# Patient Record
Sex: Male | Born: 1945 | Race: White | Hispanic: No | Smoking: Former smoker
Health system: Southern US, Community
[De-identification: ages and names within clinical notes are randomized; demographics above are authoritative.]

## PROBLEM LIST (undated history)

## (undated) HISTORY — PX: CATARACT EXTRACTION: SUR2

## (undated) HISTORY — PX: TONSILLECTOMY: SUR1361

---

## 2009-01-09 ENCOUNTER — Ambulatory Visit (HOSPITAL_COMMUNITY): Admission: RE | Admit: 2009-01-09 | Discharge: 2009-01-09 | Payer: Self-pay | Admitting: Ophthalmology

## 2010-05-27 LAB — BASIC METABOLIC PANEL
CO2: 30 mEq/L (ref 19–32)
Calcium: 9.1 mg/dL (ref 8.4–10.5)
Creatinine, Ser: 0.95 mg/dL (ref 0.4–1.5)
GFR calc Af Amer: >60 mL/min (ref >60)
GFR calc non Af Amer: >60 mL/min (ref >60)

## 2010-05-27 LAB — HEMOGLOBIN AND HEMATOCRIT, BLOOD: HCT: 43.2 % (ref 39.0–52.0)

## 2011-02-10 ENCOUNTER — Encounter (HOSPITAL_COMMUNITY): Payer: Self-pay | Admitting: Pharmacy Technician

## 2011-02-18 ENCOUNTER — Encounter (HOSPITAL_COMMUNITY): Payer: Self-pay

## 2011-02-18 ENCOUNTER — Other Ambulatory Visit: Payer: Self-pay

## 2011-02-18 ENCOUNTER — Encounter (HOSPITAL_COMMUNITY)
Admission: RE | Admit: 2011-02-18 | Discharge: 2011-02-18 | Disposition: A | Discharge status: Home / Self Care | Payer: Medicare Other | Source: Ambulatory Visit | Attending: Ophthalmology | Admitting: Ophthalmology

## 2011-02-18 LAB — CBC
HCT: 42.5 % (ref 39.0–52.0)
MCHC: 32.7 g/dL (ref 30.0–36.0)
RDW: 13.8 % (ref 11.5–15.5)

## 2011-02-18 NOTE — Patient Instructions (Addendum)
19 EVONTE PRESTAGE  02/18/2011   Your procedure is scheduled on:  02/25/2011  Report to [facility] at  615  AM.  Call this number if you have problems the morning of surgery: [phone]   Remember:   Do not eat food:After Midnight.  May have clear liquids:until Midnight .  Clear liquids include soda, tea, black coffee, apple or grape juice, broth.  Take these medicines the morning of surgery with A SIP OF WATER: none   Do not wear jewelry, make-up or nail polish.  Do not wear lotions, powders, or perfumes. You may wear deodorant.  Do not shave 48 hours prior to surgery.  Do not bring valuables to the hospital.  Contacts, dentures or bridgework may not be worn into surgery.  Leave suitcase in the car. After surgery it may be brought to your room.  For patients admitted to the hospital, checkout time is 11:00 AM the day of discharge.   Patients discharged the day of surgery will not be allowed to drive home.  Name and phone number of your driver: family  Special Instructions: N/A   Please read over the following fact sheets that you were given: Pain Booklet, Surgical Site Infection Prevention, Anesthesia Post-op Instructions and Care and Recovery After Surgery Cataract A cataract is a clouding of the lens of the eye. It is most often related to aging. A cataract is not a "film" over the surface of the eye. The lens is inside the eye and changes size of the pupil. The lens can enlarge to let more light enter the eye in dark environments and contract the size of the pupil to let in bright light. The lens is the part of the eye that helps focus light on the retina. The retina is the eye's light-sensitive layer. It is in the back of the eye that sends visual signals to the brain. In a normal eye, light passes through the lens and gets focused on the retina. To help produce a sharp image, the lens must remain clear. When a lens becomes cloudy, vision is compromised by the degree and nature of the  clouding. Certain cataracts make people more near-sighted as they develop, others increase glare, and all reduce vision to some degree or another. A cataract that is so dense that it becomes milky white and a white opacity can be seen through the pupil. When the white color is seen, it is called a "mature" or "hyper-mature cataract." Such cataracts cause total blindness in the affected eye. The cataract must be removed to prevent damage to the eye itself. Some types of cataracts can cause a secondary disease of the eye, such as certain types of glaucoma. In the early stages, better lighting and eyeglasses may lessen vision problems caused by cataracts. At a certain point, surgery may be needed to improve vision. CAUSES   Aging. However, cataracts may occur at any age, even in newborns.   Certain drugs.   Trauma to the eye.   Certain diseases (such as diabetes).   Inherited or acquired medical syndromes.  SYMPTOMS   Gradual, progressive drop in vision in the affected eye. Cataracts may develop at different rates in each eye. Cataracts may even be in just one eye with the other unaffected.   Cataracts due to trauma may develop quickly, sometimes over a matter or days or even hours. The result is severe and rapid visual loss.  DIAGNOSIS  To detect a cataract, an eye doctor examines the lens.  A well developed cataract can be diagnosed without dilating the pupil. Early cataracts and others of a specific nature are best diagnosed with an exam of the eyes with the pupils dilated by drops. TREATMENT   For an early cataract, vision may improve by using different eyeglasses or stronger lighting.   If the above measures do not help, surgery is the only effective treatment. This treatment removes the cloudy lens and replaces it with a substitute lens (Intraocular lens, or IOL). Newly developed IOL technology allows the implanted lens to improve vision both at a distance and up close. Discuss with your  eye surgeon about the possibility of still needing glasses. Also discuss how visual coordination between both eyes will be affected.  A cataract needs to be removed only when vision loss interferes with your everyday activities such as driving, reading or watching TV. You and your eye doctor can make that decision together. In most cases, waiting until you are ready to have cataract surgery will not harm your eye. If you have cataracts in both eyes, only one should be removed at a time. This allows the operated eye to heal and be out of danger from serious problems (such as infection or poor wound healing) before having the other eye undergo surgery.  Sometimes, a cataract should be removed even if it does not cause problems with your vision. For example, a cataract should be removed if it prevents examination or treatment of another eye problem. Just as you cannot see out of the affected eye well, your doctor cannot see into your eye well through a cataract. The vast majority of people who have cataract surgery have better vision afterward. CATARACT REMOVAL There are two primary ways to remove a cataract. Your doctor can explain the differences and help determine which is best for you:  Phacoemulsification (small incision cataract surgery). This involves making a small cut (incision) on the edge of the clear, dome-shaped surface that covers the front of the eye (the cornea). An injection behind the eye or eye drops are given to make this a painless procedure. The doctor then inserts a tiny probe into the eye. This device emits ultrasound waves that soften and break up the cloudy center of the lens so it can be removed by suction. Most cataract surgery is done this way. The cuts are usually so small and performed in such a manner that often no sutures are needed to keep it closed.   Extracapsular surgery. Your doctor makes a slightly longer incision on the side of the cornea. The doctor removes the hard  center of the lens. The remainder of the lens is then removed by suction. In some cases, extremely fine sutures are needed which the doctor may, or may not remove in the office after the surgery.  When an IOL is implanted, it needs no care. It becomes a permanent part of your eye and cannot be seen or felt.  Some people cannot have an IOL. They may have problems during surgery, or maybe they have another eye disease. For these people, a soft contact lens may be suggested. If an IOL or contact lens cannot be used, very powerful and thick glasses are required after surgery. Since vision is very different through such thick glasses, it is important to have your doctor discuss the impact on your vision after any cataract surgery where there is no plan to implant an IOL. The normal lens of the eye is covered by a clear capsule. Both  phacoemulsification and extracapsular surgery require that the back surface of this lens capsule be left in place. This helps support IOLs and prevents the IOL from dislocating and falling back into the deeper interior of the eye. Right after surgery, and often permanently this "posterior capsule" remains clear. In some cases however, it can become cloudy, presenting the same type of visual compromise that the original cataract did since light is again obstructed as it passes through the clear IOL. This condition is often referred to as an "after-cataract." Fortunately, after-cataracts are easily treated using a painless and very fast laser treatment that is performed without anesthesia or incisions. It is done in a matter of minutes in an outpatient environment. Visual improvement is often immediate.  HOME CARE INSTRUCTIONS   Your surgeon will discuss pre and post operative care with you prior to surgery. The majority of people are able to do almost all normal activities right away. Although, it is often advised to avoid strenuous activity for a period of time.   Postoperative drops  and careful avoidance of infection will be needed. Many surgeons suggest the use of a protective shield during the first few days after surgery.   There is a very small incidence of complication from modern cataract surgery, but it can happen. Infection that spreads to the inside of the eye (endophthalmitis) can result in total visual loss and even loss of the eye itself. In extremely rare instances, the inflammation of endophthalmitis can spread to both eyes (sympathetic ophthalmia). Appropriate post-operative care under the close observation of your surgeon is essential to a successful outcome.  SEEK IMMEDIATE MEDICAL CARE IF:   You have any sudden drop of vision in the operated eye.   You have pain in the operated eye.   You see a large number of floating dots in the field of vision in the operated eye.   You see flashing lights, or if a portion of your side vision in any direction appears black (like a curtain being drawn into your field of vision) in the operated eye.  Document Released: 02/08/2005 Document Revised: 10/21/2010 Document Reviewed: 03/27/2007 [facility] Patient Information 2012 [place], [place].PATIENT INSTRUCTIONS POST-ANESTHESIA  IMMEDIATELY FOLLOWING SURGERY:  Do not drive or operate machinery for the first twenty four hours after surgery.  Do not make any important decisions for twenty four hours after surgery or while taking narcotic pain medications or sedatives.  If you develop intractable nausea and vomiting or a severe headache please notify your doctor immediately.  FOLLOW-UP:  Please make an appointment with your surgeon as instructed. You do not need to follow up with anesthesia unless specifically instructed to do so.  WOUND CARE INSTRUCTIONS (if applicable):  Keep a dry clean dressing on the anesthesia/puncture wound site if there is drainage.  Once the wound has quit draining you may leave it open to air.  Generally you should leave the bandage intact for twenty  four hours unless there is drainage.  If the epidural site drains for more than 36-48 hours please call the anesthesia department.  QUESTIONS?:  Please feel free to call your physician or the hospital operator if you have any questions, and they will be happy to assist you.     [facility] Anesthesia Department [address] [place] [phone]

## 2011-02-25 ENCOUNTER — Encounter (HOSPITAL_COMMUNITY): Payer: Self-pay | Admitting: Anesthesiology

## 2011-02-25 ENCOUNTER — Ambulatory Visit (HOSPITAL_COMMUNITY)
Admission: RE | Admit: 2011-02-25 | Discharge: 2011-02-25 | Disposition: A | Discharge status: Home / Self Care | Payer: Medicare Other | Source: Ambulatory Visit | Attending: Ophthalmology | Admitting: Ophthalmology

## 2011-02-25 ENCOUNTER — Encounter (HOSPITAL_COMMUNITY): Payer: Self-pay | Admitting: *Deleted

## 2011-02-25 ENCOUNTER — Encounter (HOSPITAL_COMMUNITY)
Admission: RE | Disposition: A | Discharge status: Home / Self Care | Payer: Self-pay | Source: Ambulatory Visit | Attending: Ophthalmology

## 2011-02-25 ENCOUNTER — Ambulatory Visit (HOSPITAL_COMMUNITY): Payer: Medicare Other | Admitting: Anesthesiology

## 2011-02-25 DIAGNOSIS — H2510 Age-related nuclear cataract, unspecified eye: Secondary | ICD-10-CM | POA: Insufficient documentation

## 2011-02-25 DIAGNOSIS — Z01812 Encounter for preprocedural laboratory examination: Secondary | ICD-10-CM | POA: Insufficient documentation

## 2011-02-25 DIAGNOSIS — Z01810 Encounter for preprocedural cardiovascular examination: Secondary | ICD-10-CM | POA: Diagnosis not present

## 2011-02-25 DIAGNOSIS — H269 Unspecified cataract: Secondary | ICD-10-CM | POA: Diagnosis not present

## 2011-02-25 HISTORY — PX: CATARACT EXTRACTION W/PHACO: SHX586

## 2011-02-25 SURGERY — PHACOEMULSIFICATION, CATARACT, WITH IOL INSERTION
Anesthesia: Monitor Anesthesia Care | Site: Eye | Laterality: Right | Wound class: Clean

## 2011-02-25 MED ORDER — MIDAZOLAM HCL 2 MG/2ML IJ SOLN
1.0000 mg | INTRAMUSCULAR | Status: DC | PRN
  Administered 2011-02-25: 2 mg via INTRAVENOUS

## 2011-02-25 MED ORDER — LACTATED RINGERS IV SOLN
INTRAVENOUS | Status: DC | PRN
  Administered 2011-02-25: 1000 mL
  Administered 2011-02-25: 07:00:00 via INTRAVENOUS

## 2011-02-25 MED ORDER — LIDOCAINE HCL (PF) 1 % IJ SOLN
INTRAMUSCULAR | Status: AC
  Filled 2011-02-25: qty 2

## 2011-02-25 MED ORDER — PROVISC 10 MG/ML IO SOLN
INTRAOCULAR | Status: DC | PRN
  Administered 2011-02-25: 8.5 mg via INTRAOCULAR

## 2011-02-25 MED ORDER — PHENYLEPHRINE HCL 2.5 % OP SOLN
1.0000 [drp] | OPHTHALMIC | Status: AC
  Administered 2011-02-25 (×3): 1 [drp] via OPHTHALMIC

## 2011-02-25 MED ORDER — TETRACAINE HCL 0.5 % OP SOLN
OPHTHALMIC | Status: AC
  Administered 2011-02-25: 1 [drp] via OPHTHALMIC
  Filled 2011-02-25: qty 2

## 2011-02-25 MED ORDER — TETRACAINE HCL 0.5 % OP SOLN
1.0000 [drp] | OPHTHALMIC | Status: AC
  Administered 2011-02-25 (×3): 1 [drp] via OPHTHALMIC

## 2011-02-25 MED ORDER — BSS IO SOLN
INTRAOCULAR | Status: DC | PRN
  Administered 2011-02-25: 15 mL via INTRAOCULAR

## 2011-02-25 MED ORDER — CYCLOPENTOLATE-PHENYLEPHRINE 0.2-1 % OP SOLN
OPHTHALMIC | Status: AC
  Administered 2011-02-25: 1 [drp] via OPHTHALMIC
  Filled 2011-02-25: qty 2

## 2011-02-25 MED ORDER — MIDAZOLAM HCL 2 MG/2ML IJ SOLN
INTRAMUSCULAR | Status: AC
  Filled 2011-02-25: qty 2

## 2011-02-25 MED ORDER — LIDOCAINE 3.5 % OP GEL OPTIME - NO CHARGE
OPHTHALMIC | Status: DC | PRN
  Administered 2011-02-25: 1 [drp] via OPHTHALMIC

## 2011-02-25 MED ORDER — LACTATED RINGERS IV SOLN: INTRAVENOUS | Status: DC

## 2011-02-25 MED ORDER — EPINEPHRINE HCL 1 MG/ML IJ SOLN
INTRAMUSCULAR | Status: AC
  Filled 2011-02-25: qty 1

## 2011-02-25 MED ORDER — LIDOCAINE HCL 3.5 % OP GEL
1.0000 "application " | Freq: Once | OPHTHALMIC | Status: AC
  Administered 2011-02-25: 1 via OPHTHALMIC

## 2011-02-25 MED ORDER — EPINEPHRINE HCL 1 MG/ML IJ SOLN
INTRAMUSCULAR | Status: DC | PRN
  Administered 2011-02-25: 07:00:00 via OPHTHALMIC

## 2011-02-25 MED ORDER — EPINEPHRINE HCL 1 MG/ML IJ SOLN
INTRAOCULAR | Status: DC | PRN
  Administered 2011-02-25: 08:00:00

## 2011-02-25 MED ORDER — LIDOCAINE HCL 3.5 % OP GEL
OPHTHALMIC | Status: AC
  Administered 2011-02-25: 1 via OPHTHALMIC
  Filled 2011-02-25: qty 5

## 2011-02-25 MED ORDER — NEOMYCIN-POLYMYXIN-DEXAMETH 0.1 % OP OINT
TOPICAL_OINTMENT | OPHTHALMIC | Status: DC | PRN
  Administered 2011-02-25: 1 via OPHTHALMIC

## 2011-02-25 MED ORDER — NEOMYCIN-POLYMYXIN-DEXAMETH 3.5-10000-0.1 OP OINT
TOPICAL_OINTMENT | OPHTHALMIC | Status: AC
  Filled 2011-02-25: qty 3.5

## 2011-02-25 MED ORDER — PHENYLEPHRINE HCL 2.5 % OP SOLN
OPHTHALMIC | Status: AC
  Administered 2011-02-25: 1 [drp] via OPHTHALMIC
  Filled 2011-02-25: qty 2

## 2011-02-25 MED ORDER — CYCLOPENTOLATE-PHENYLEPHRINE 0.2-1 % OP SOLN
1.0000 [drp] | OPHTHALMIC | Status: AC
  Administered 2011-02-25 (×3): 1 [drp] via OPHTHALMIC

## 2011-02-25 MED ORDER — POVIDONE-IODINE 5 % OP SOLN
OPHTHALMIC | Status: DC | PRN
  Administered 2011-02-25: 1 via OPHTHALMIC

## 2011-02-25 SURGICAL SUPPLY — 32 items

## 2011-02-25 NOTE — H&P (Signed)
I have reviewed the H&P, the patient was re-examined, and I have identified no interval changes in medical condition and plan of care since the history and physical of record  

## 2011-02-25 NOTE — Anesthesia Preprocedure Evaluation (Addendum)
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 Normal    Neuro/Psych    GI/Hepatic negative GI ROS,   Endo/Other    Renal/GU      Musculoskeletal   Abdominal   Peds  Hematology   Anesthesia Other Findings   Reproductive/Obstetrics                           Anesthesia Physical Anesthesia Plan  ASA: I  Anesthesia Plan: MAC   Post-op Pain Management:    Induction: Intravenous  Airway Management Planned: Nasal Cannula  Additional Equipment:   Intra-op Plan:   Post-operative Plan:   Informed Consent: I have reviewed the patients History and Physical, chart, labs and discussed the procedure including the risks, benefits and alternatives for the proposed anesthesia with the patient or authorized representative who has indicated his/her understanding and acceptance.     Plan Discussed with:   Anesthesia Plan Comments:        Anesthesia Quick Evaluation

## 2011-02-25 NOTE — Op Note (Signed)
NAME:  Dennis Daniel, Dennis Daniel NO.:  [id number]  MEDICAL RECORD NO.:  [id number]  LOCATION:  [facility]                          FACILITY:  [facility]  PHYSICIAN:  Susanne Greenhouse, MD       DATE OF BIRTH:  01/02/1946  DATE OF PROCEDURE:  02/25/2011 DATE OF DISCHARGE:  02/25/2011                              OPERATIVE REPORT   PREOPERATIVE DIAGNOSIS:  Nuclear cataract, right eye, diagnosis code 366.16.  POSTOPERATIVE DIAGNOSIS:  Nuclear cataract, right eye, diagnosis code 366.16.  OPERATION PERFORMED:  Phacoemulsification with posterior chamber intraocular lens implantation, right eye.  SURGEON:  Bonne Dolores. Catalino Plascencia, MD  ANESTHESIA:  General endotracheal anesthesia.  OPERATIVE SUMMARY:  In the preoperative area, dilating drops were placed into the right eye.  The patient was then brought into the operating room where she was placed under general anesthesia.  The eye was then prepped and draped.  Beginning with a 75 blade, a paracentesis port was made at the surgeon's 2 o'clock position.  The anterior chamber was then filled with a 1% nonpreserved lidocaine solution with epinephrine.  This was followed by Viscoat to deepen the chamber.  A small fornix-based peritomy was performed superiorly.  Next, a single iris hook was placed through the limbus superiorly.  A 2.4-mm keratome blade was then used to make a clear corneal incision over the iris hook.  A bent cystotome needle and Utrata forceps were used to create a continuous tear capsulotomy.  Hydrodissection was performed using balanced salt solution on a fine cannula.  The lens nucleus was then removed using phacoemulsification in a quadrant cracking technique.  The cortical material was then removed with irrigation and aspiration.  The capsular bag and anterior chamber were refilled with Provisc.  The wound was widened to approximately 3 mm and a posterior chamber intraocular lens was placed into the capsular bag without difficulty  using an [employer] lens injecting system.  A single 10-0 nylon suture was then used to close the incision as well as stromal hydration.  The Provisc was removed from the anterior chamber and capsular bag with irrigation and aspiration.  At this point, the wounds were tested for leak, which were negative.  The anterior chamber remained deep and stable.  The patient tolerated the procedure well.  There were no operative complications, and she awoke from general anesthesia without problem.  No surgical specimens.  Prosthetic device used is a Lenstec posterior chamber lens, model Softec HD, power of 16.0, serial number is [id number].          ______________________________ Susanne Greenhouse, MD     KEH/MEDQ  D:  02/25/2011  T:  02/25/2011  Job:  [id number]

## 2011-02-25 NOTE — Brief Op Note (Signed)
Pre-Op Dx: Cataract OD Post-Op Dx: Cataract OD Surgeon: Ryzen Deady Anesthesia: Topical with MAC Implant: Lenstec, Model Softec HD Blood Loss: None Specimen: None Complications: None 

## 2011-02-25 NOTE — Anesthesia Postprocedure Evaluation (Signed)
  Anesthesia Post-op Note  Patient: Dennis Daniel  Procedure(s) Performed:  CATARACT EXTRACTION PHACO AND INTRAOCULAR LENS PLACEMENT (IOC) - CDE: 10.71  Patient Location: Short Stay  Anesthesia Type: MAC  Level of Consciousness: awake, alert  and oriented  Airway and Oxygen Therapy: Patient Spontanous Breathing  Post-op Pain: none  Post-op Assessment: Post-op Vital signs reviewed, Patient's Cardiovascular Status Stable, Respiratory Function Stable, Patent Airway and No signs of Nausea or vomiting  Post-op Vital Signs: Reviewed and stable  ComplicationsNone

## 2011-02-25 NOTE — Preoperative (Signed)
Beta Blockers   Reason not to administer Beta Blockers:Not Applicable 

## 2011-02-25 NOTE — Transfer of Care (Signed)
Immediate Anesthesia Transfer of Care Note  Patient: Dennis Daniel  Procedure(s) Performed:  CATARACT EXTRACTION PHACO AND INTRAOCULAR LENS PLACEMENT (IOC) - CDE: 10.71  Patient Location: Short stay  Anesthesia Type: MAC  Level of Consciousness: awake, alert , oriented and patient cooperative  Airway & Oxygen Therapy: Patient Spontanous Breathing  Post-op Assessment: Report given to PACU RN and Post -op Vital signs reviewed and stable  Post vital signs: Reviewed and stable  Complications: No apparent anesthesia complications

## 2011-02-25 NOTE — Anesthesia Procedure Notes (Signed)
Procedure Name: MAC Date/Time: 02/25/2011 7:22 AM Performed by: Carolyne Littles, AMY Pre-anesthesia Checklist: Patient identified, Patient being monitored, Emergency Drugs available, Timeout performed and Suction available Patient Re-evaluated:Patient Re-evaluated prior to inductionOxygen Delivery Method: Nasal Cannula

## 2011-03-01 ENCOUNTER — Encounter (HOSPITAL_COMMUNITY): Payer: Self-pay | Admitting: Ophthalmology

## 2011-03-09 DIAGNOSIS — Z23 Encounter for immunization: Secondary | ICD-10-CM | POA: Diagnosis not present

## 2012-05-01 DIAGNOSIS — H52229 Regular astigmatism, unspecified eye: Secondary | ICD-10-CM | POA: Diagnosis not present

## 2012-05-01 DIAGNOSIS — H524 Presbyopia: Secondary | ICD-10-CM | POA: Diagnosis not present

## 2012-05-01 DIAGNOSIS — H5200 Hypermetropia, unspecified eye: Secondary | ICD-10-CM | POA: Diagnosis not present

## 2012-05-01 DIAGNOSIS — H35039 Hypertensive retinopathy, unspecified eye: Secondary | ICD-10-CM | POA: Diagnosis not present

## 2012-12-29 DIAGNOSIS — Z23 Encounter for immunization: Secondary | ICD-10-CM | POA: Diagnosis not present

## 2013-03-20 DIAGNOSIS — Z0000 Encounter for general adult medical examination without abnormal findings: Secondary | ICD-10-CM | POA: Diagnosis not present

## 2013-03-20 DIAGNOSIS — Z125 Encounter for screening for malignant neoplasm of prostate: Secondary | ICD-10-CM | POA: Diagnosis not present

## 2013-03-28 DIAGNOSIS — Z0000 Encounter for general adult medical examination without abnormal findings: Secondary | ICD-10-CM | POA: Diagnosis not present

## 2013-03-28 DIAGNOSIS — Z23 Encounter for immunization: Secondary | ICD-10-CM | POA: Diagnosis not present

## 2013-03-28 DIAGNOSIS — Z1212 Encounter for screening for malignant neoplasm of rectum: Secondary | ICD-10-CM | POA: Diagnosis not present

## 2013-04-24 DIAGNOSIS — Z0000 Encounter for general adult medical examination without abnormal findings: Secondary | ICD-10-CM | POA: Diagnosis not present

## 2014-03-22 DIAGNOSIS — Z0000 Encounter for general adult medical examination without abnormal findings: Secondary | ICD-10-CM | POA: Diagnosis not present

## 2014-03-22 DIAGNOSIS — Z125 Encounter for screening for malignant neoplasm of prostate: Secondary | ICD-10-CM | POA: Diagnosis not present

## 2014-03-29 DIAGNOSIS — R55 Syncope and collapse: Secondary | ICD-10-CM | POA: Diagnosis not present

## 2014-03-29 DIAGNOSIS — Z23 Encounter for immunization: Secondary | ICD-10-CM | POA: Diagnosis not present

## 2014-03-29 DIAGNOSIS — R001 Bradycardia, unspecified: Secondary | ICD-10-CM | POA: Diagnosis not present

## 2014-03-29 DIAGNOSIS — L570 Actinic keratosis: Secondary | ICD-10-CM | POA: Diagnosis not present

## 2014-03-29 DIAGNOSIS — E786 Lipoprotein deficiency: Secondary | ICD-10-CM | POA: Diagnosis not present

## 2014-04-08 DIAGNOSIS — L570 Actinic keratosis: Secondary | ICD-10-CM | POA: Diagnosis not present

## 2014-04-08 DIAGNOSIS — X32XXXA Exposure to sunlight, initial encounter: Secondary | ICD-10-CM | POA: Diagnosis not present

## 2014-04-08 DIAGNOSIS — D225 Melanocytic nevi of trunk: Secondary | ICD-10-CM | POA: Diagnosis not present

## 2014-06-06 DIAGNOSIS — Z23 Encounter for immunization: Secondary | ICD-10-CM | POA: Diagnosis not present

## 2014-11-06 DIAGNOSIS — H524 Presbyopia: Secondary | ICD-10-CM | POA: Diagnosis not present

## 2014-11-06 DIAGNOSIS — H5203 Hypermetropia, bilateral: Secondary | ICD-10-CM | POA: Diagnosis not present

## 2014-11-06 DIAGNOSIS — H02102 Unspecified ectropion of right lower eyelid: Secondary | ICD-10-CM | POA: Diagnosis not present

## 2014-11-06 DIAGNOSIS — H52223 Regular astigmatism, bilateral: Secondary | ICD-10-CM | POA: Diagnosis not present

## 2014-12-25 DIAGNOSIS — Z23 Encounter for immunization: Secondary | ICD-10-CM | POA: Diagnosis not present

## 2015-03-27 DIAGNOSIS — E785 Hyperlipidemia, unspecified: Secondary | ICD-10-CM | POA: Diagnosis not present

## 2015-04-03 DIAGNOSIS — Z6825 Body mass index (BMI) 25.0-25.9, adult: Secondary | ICD-10-CM | POA: Diagnosis not present

## 2015-04-03 DIAGNOSIS — E785 Hyperlipidemia, unspecified: Secondary | ICD-10-CM | POA: Diagnosis not present

## 2015-04-03 DIAGNOSIS — R001 Bradycardia, unspecified: Secondary | ICD-10-CM | POA: Diagnosis not present

## 2015-09-01 DIAGNOSIS — H35033 Hypertensive retinopathy, bilateral: Secondary | ICD-10-CM | POA: Diagnosis not present

## 2015-09-01 DIAGNOSIS — H52223 Regular astigmatism, bilateral: Secondary | ICD-10-CM | POA: Diagnosis not present

## 2015-09-01 DIAGNOSIS — H5203 Hypermetropia, bilateral: Secondary | ICD-10-CM | POA: Diagnosis not present

## 2015-09-01 DIAGNOSIS — H524 Presbyopia: Secondary | ICD-10-CM | POA: Diagnosis not present

## 2016-04-07 DIAGNOSIS — E785 Hyperlipidemia, unspecified: Secondary | ICD-10-CM | POA: Diagnosis not present

## 2016-04-07 DIAGNOSIS — R001 Bradycardia, unspecified: Secondary | ICD-10-CM | POA: Diagnosis not present

## 2016-04-07 DIAGNOSIS — Z79899 Other long term (current) drug therapy: Secondary | ICD-10-CM | POA: Diagnosis not present

## 2016-04-15 DIAGNOSIS — Z6823 Body mass index (BMI) 23.0-23.9, adult: Secondary | ICD-10-CM | POA: Diagnosis not present

## 2016-04-15 DIAGNOSIS — E785 Hyperlipidemia, unspecified: Secondary | ICD-10-CM | POA: Diagnosis not present

## 2016-04-15 DIAGNOSIS — R001 Bradycardia, unspecified: Secondary | ICD-10-CM | POA: Diagnosis not present

## 2016-09-03 DIAGNOSIS — H02102 Unspecified ectropion of right lower eyelid: Secondary | ICD-10-CM | POA: Diagnosis not present

## 2016-09-03 DIAGNOSIS — Z961 Presence of intraocular lens: Secondary | ICD-10-CM | POA: Diagnosis not present

## 2016-09-03 DIAGNOSIS — H43811 Vitreous degeneration, right eye: Secondary | ICD-10-CM | POA: Diagnosis not present

## 2016-09-03 DIAGNOSIS — H35033 Hypertensive retinopathy, bilateral: Secondary | ICD-10-CM | POA: Diagnosis not present

## 2017-04-16 DIAGNOSIS — E785 Hyperlipidemia, unspecified: Secondary | ICD-10-CM | POA: Diagnosis not present

## 2017-04-16 DIAGNOSIS — R001 Bradycardia, unspecified: Secondary | ICD-10-CM | POA: Diagnosis not present

## 2017-04-16 DIAGNOSIS — Z79899 Other long term (current) drug therapy: Secondary | ICD-10-CM | POA: Diagnosis not present

## 2017-04-21 DIAGNOSIS — Z6823 Body mass index (BMI) 23.0-23.9, adult: Secondary | ICD-10-CM | POA: Diagnosis not present

## 2017-04-21 DIAGNOSIS — R001 Bradycardia, unspecified: Secondary | ICD-10-CM | POA: Diagnosis not present

## 2017-04-21 DIAGNOSIS — Z23 Encounter for immunization: Secondary | ICD-10-CM | POA: Diagnosis not present

## 2017-04-21 DIAGNOSIS — R3121 Asymptomatic microscopic hematuria: Secondary | ICD-10-CM | POA: Diagnosis not present

## 2017-04-21 DIAGNOSIS — E785 Hyperlipidemia, unspecified: Secondary | ICD-10-CM | POA: Diagnosis not present

## 2017-05-05 DIAGNOSIS — R3129 Other microscopic hematuria: Secondary | ICD-10-CM | POA: Diagnosis not present

## 2018-04-18 DIAGNOSIS — Z79899 Other long term (current) drug therapy: Secondary | ICD-10-CM | POA: Diagnosis not present

## 2018-04-18 DIAGNOSIS — E785 Hyperlipidemia, unspecified: Secondary | ICD-10-CM | POA: Diagnosis not present

## 2018-04-18 DIAGNOSIS — R001 Bradycardia, unspecified: Secondary | ICD-10-CM | POA: Diagnosis not present

## 2018-04-25 DIAGNOSIS — R001 Bradycardia, unspecified: Secondary | ICD-10-CM | POA: Diagnosis not present

## 2018-04-25 DIAGNOSIS — E785 Hyperlipidemia, unspecified: Secondary | ICD-10-CM | POA: Diagnosis not present

## 2018-04-25 DIAGNOSIS — Z6824 Body mass index (BMI) 24.0-24.9, adult: Secondary | ICD-10-CM | POA: Diagnosis not present

## 2018-10-10 DIAGNOSIS — H35033 Hypertensive retinopathy, bilateral: Secondary | ICD-10-CM | POA: Diagnosis not present

## 2018-10-23 DIAGNOSIS — H02132 Senile ectropion of right lower eyelid: Secondary | ICD-10-CM | POA: Diagnosis not present

## 2018-11-14 DIAGNOSIS — H02112 Cicatricial ectropion of right lower eyelid: Secondary | ICD-10-CM | POA: Diagnosis not present

## 2019-01-28 DIAGNOSIS — Z20828 Contact with and (suspected) exposure to other viral communicable diseases: Secondary | ICD-10-CM | POA: Diagnosis not present

## 2019-01-28 DIAGNOSIS — H02112 Cicatricial ectropion of right lower eyelid: Secondary | ICD-10-CM | POA: Diagnosis not present

## 2019-01-31 DIAGNOSIS — H02132 Senile ectropion of right lower eyelid: Secondary | ICD-10-CM | POA: Diagnosis not present

## 2019-01-31 DIAGNOSIS — H02103 Unspecified ectropion of right eye, unspecified eyelid: Secondary | ICD-10-CM | POA: Diagnosis not present

## 2019-01-31 DIAGNOSIS — H02113 Cicatricial ectropion of right eye, unspecified eyelid: Secondary | ICD-10-CM | POA: Diagnosis not present

## 2019-01-31 DIAGNOSIS — H02112 Cicatricial ectropion of right lower eyelid: Secondary | ICD-10-CM | POA: Diagnosis not present

## 2019-04-18 DIAGNOSIS — Z23 Encounter for immunization: Secondary | ICD-10-CM | POA: Diagnosis not present

## 2019-04-19 DIAGNOSIS — E785 Hyperlipidemia, unspecified: Secondary | ICD-10-CM | POA: Diagnosis not present

## 2019-04-19 DIAGNOSIS — R001 Bradycardia, unspecified: Secondary | ICD-10-CM | POA: Diagnosis not present

## 2019-04-19 DIAGNOSIS — Z79899 Other long term (current) drug therapy: Secondary | ICD-10-CM | POA: Diagnosis not present

## 2019-04-26 DIAGNOSIS — E785 Hyperlipidemia, unspecified: Secondary | ICD-10-CM | POA: Diagnosis not present

## 2019-04-26 DIAGNOSIS — R001 Bradycardia, unspecified: Secondary | ICD-10-CM | POA: Diagnosis not present

## 2019-05-16 DIAGNOSIS — Z23 Encounter for immunization: Secondary | ICD-10-CM | POA: Diagnosis not present

## 2019-09-25 DIAGNOSIS — H43813 Vitreous degeneration, bilateral: Secondary | ICD-10-CM | POA: Diagnosis not present

## 2019-12-21 DIAGNOSIS — Z23 Encounter for immunization: Secondary | ICD-10-CM | POA: Diagnosis not present

## 2020-04-23 DIAGNOSIS — G3184 Mild cognitive impairment, so stated: Secondary | ICD-10-CM | POA: Diagnosis not present

## 2020-04-23 DIAGNOSIS — R001 Bradycardia, unspecified: Secondary | ICD-10-CM | POA: Diagnosis not present

## 2020-04-23 DIAGNOSIS — E785 Hyperlipidemia, unspecified: Secondary | ICD-10-CM | POA: Diagnosis not present

## 2020-04-23 DIAGNOSIS — E538 Deficiency of other specified B group vitamins: Secondary | ICD-10-CM | POA: Diagnosis not present

## 2020-04-23 DIAGNOSIS — Z79899 Other long term (current) drug therapy: Secondary | ICD-10-CM | POA: Diagnosis not present

## 2020-05-02 DIAGNOSIS — E039 Hypothyroidism, unspecified: Secondary | ICD-10-CM | POA: Diagnosis not present

## 2020-05-02 DIAGNOSIS — Z6821 Body mass index (BMI) 21.0-21.9, adult: Secondary | ICD-10-CM | POA: Diagnosis not present

## 2020-05-02 DIAGNOSIS — D519 Vitamin B12 deficiency anemia, unspecified: Secondary | ICD-10-CM | POA: Diagnosis not present

## 2020-05-02 DIAGNOSIS — R001 Bradycardia, unspecified: Secondary | ICD-10-CM | POA: Diagnosis not present

## 2020-08-13 DIAGNOSIS — Z79899 Other long term (current) drug therapy: Secondary | ICD-10-CM | POA: Diagnosis not present

## 2020-08-13 DIAGNOSIS — D519 Vitamin B12 deficiency anemia, unspecified: Secondary | ICD-10-CM | POA: Diagnosis not present

## 2020-08-13 DIAGNOSIS — E039 Hypothyroidism, unspecified: Secondary | ICD-10-CM | POA: Diagnosis not present

## 2020-08-13 DIAGNOSIS — R001 Bradycardia, unspecified: Secondary | ICD-10-CM | POA: Diagnosis not present

## 2020-08-18 DIAGNOSIS — D519 Vitamin B12 deficiency anemia, unspecified: Secondary | ICD-10-CM | POA: Diagnosis not present

## 2020-08-18 DIAGNOSIS — E039 Hypothyroidism, unspecified: Secondary | ICD-10-CM | POA: Diagnosis not present

## 2020-11-14 DIAGNOSIS — E039 Hypothyroidism, unspecified: Secondary | ICD-10-CM | POA: Diagnosis not present

## 2020-11-14 DIAGNOSIS — R001 Bradycardia, unspecified: Secondary | ICD-10-CM | POA: Diagnosis not present

## 2020-11-14 DIAGNOSIS — G3184 Mild cognitive impairment, so stated: Secondary | ICD-10-CM | POA: Diagnosis not present

## 2020-11-14 DIAGNOSIS — D519 Vitamin B12 deficiency anemia, unspecified: Secondary | ICD-10-CM | POA: Diagnosis not present

## 2020-11-14 DIAGNOSIS — Z79899 Other long term (current) drug therapy: Secondary | ICD-10-CM | POA: Diagnosis not present

## 2020-11-20 DIAGNOSIS — Z23 Encounter for immunization: Secondary | ICD-10-CM | POA: Diagnosis not present

## 2020-11-20 DIAGNOSIS — D519 Vitamin B12 deficiency anemia, unspecified: Secondary | ICD-10-CM | POA: Diagnosis not present

## 2020-11-20 DIAGNOSIS — E039 Hypothyroidism, unspecified: Secondary | ICD-10-CM | POA: Diagnosis not present

## 2020-12-26 DIAGNOSIS — Z20822 Contact with and (suspected) exposure to covid-19: Secondary | ICD-10-CM | POA: Diagnosis not present

## 2021-02-12 DIAGNOSIS — Z20822 Contact with and (suspected) exposure to covid-19: Secondary | ICD-10-CM | POA: Diagnosis not present

## 2021-02-17 DIAGNOSIS — E039 Hypothyroidism, unspecified: Secondary | ICD-10-CM | POA: Diagnosis not present

## 2021-02-19 DIAGNOSIS — Z20822 Contact with and (suspected) exposure to covid-19: Secondary | ICD-10-CM | POA: Diagnosis not present

## 2021-02-20 DIAGNOSIS — Z20822 Contact with and (suspected) exposure to covid-19: Secondary | ICD-10-CM | POA: Diagnosis not present

## 2021-02-27 DIAGNOSIS — E039 Hypothyroidism, unspecified: Secondary | ICD-10-CM | POA: Diagnosis not present

## 2021-02-27 DIAGNOSIS — R413 Other amnesia: Secondary | ICD-10-CM | POA: Diagnosis not present

## 2021-05-27 ENCOUNTER — Other Ambulatory Visit: Payer: Self-pay | Admitting: Internal Medicine

## 2021-05-27 ENCOUNTER — Other Ambulatory Visit (HOSPITAL_COMMUNITY): Payer: Self-pay | Admitting: Internal Medicine

## 2021-05-27 DIAGNOSIS — Z79899 Other long term (current) drug therapy: Secondary | ICD-10-CM | POA: Diagnosis not present

## 2021-05-27 DIAGNOSIS — R413 Other amnesia: Secondary | ICD-10-CM

## 2021-05-27 DIAGNOSIS — E039 Hypothyroidism, unspecified: Secondary | ICD-10-CM | POA: Diagnosis not present

## 2021-06-12 ENCOUNTER — Ambulatory Visit (HOSPITAL_COMMUNITY): Payer: Medicare PPO

## 2021-06-12 ENCOUNTER — Encounter (HOSPITAL_COMMUNITY): Payer: Self-pay

## 2021-06-30 ENCOUNTER — Ambulatory Visit (HOSPITAL_COMMUNITY): Payer: Medicare PPO

## 2021-07-03 ENCOUNTER — Ambulatory Visit (HOSPITAL_COMMUNITY)
Admission: RE | Admit: 2021-07-03 | Discharge: 2021-07-03 | Disposition: A | Discharge status: Home / Self Care | Payer: Medicare PPO | Source: Ambulatory Visit | Attending: Internal Medicine | Admitting: Internal Medicine

## 2021-07-03 DIAGNOSIS — G319 Degenerative disease of nervous system, unspecified: Secondary | ICD-10-CM | POA: Diagnosis not present

## 2021-07-03 DIAGNOSIS — R413 Other amnesia: Secondary | ICD-10-CM | POA: Diagnosis not present

## 2021-09-01 DIAGNOSIS — E039 Hypothyroidism, unspecified: Secondary | ICD-10-CM | POA: Diagnosis not present

## 2021-09-03 DIAGNOSIS — E039 Hypothyroidism, unspecified: Secondary | ICD-10-CM | POA: Diagnosis not present

## 2021-09-03 DIAGNOSIS — D519 Vitamin B12 deficiency anemia, unspecified: Secondary | ICD-10-CM | POA: Diagnosis not present

## 2021-11-16 DIAGNOSIS — E860 Dehydration: Secondary | ICD-10-CM | POA: Diagnosis not present

## 2022-03-23 DIAGNOSIS — G301 Alzheimer's disease with late onset: Secondary | ICD-10-CM | POA: Diagnosis not present

## 2022-03-23 DIAGNOSIS — D510 Vitamin B12 deficiency anemia due to intrinsic factor deficiency: Secondary | ICD-10-CM | POA: Diagnosis not present

## 2022-03-23 DIAGNOSIS — E039 Hypothyroidism, unspecified: Secondary | ICD-10-CM | POA: Diagnosis not present

## 2022-03-23 DIAGNOSIS — Z79899 Other long term (current) drug therapy: Secondary | ICD-10-CM | POA: Diagnosis not present

## 2022-03-29 DIAGNOSIS — Z20828 Contact with and (suspected) exposure to other viral communicable diseases: Secondary | ICD-10-CM | POA: Diagnosis not present

## 2022-03-29 DIAGNOSIS — G309 Alzheimer's disease, unspecified: Secondary | ICD-10-CM | POA: Diagnosis not present

## 2022-03-29 DIAGNOSIS — E559 Vitamin D deficiency, unspecified: Secondary | ICD-10-CM | POA: Diagnosis not present

## 2022-03-29 DIAGNOSIS — E039 Hypothyroidism, unspecified: Secondary | ICD-10-CM | POA: Diagnosis not present

## 2022-03-31 ENCOUNTER — Encounter (HOSPITAL_COMMUNITY): Payer: Self-pay

## 2022-03-31 ENCOUNTER — Emergency Department (HOSPITAL_COMMUNITY)
Admission: EM | Admit: 2022-03-31 | Discharge: 2022-03-31 | Disposition: A | Discharge status: Home / Self Care | Payer: Medicare PPO | Attending: Emergency Medicine | Admitting: Emergency Medicine

## 2022-03-31 DIAGNOSIS — Z1152 Encounter for screening for COVID-19: Secondary | ICD-10-CM | POA: Insufficient documentation

## 2022-03-31 DIAGNOSIS — R55 Syncope and collapse: Secondary | ICD-10-CM | POA: Diagnosis not present

## 2022-03-31 DIAGNOSIS — I959 Hypotension, unspecified: Secondary | ICD-10-CM | POA: Diagnosis not present

## 2022-03-31 DIAGNOSIS — R001 Bradycardia, unspecified: Secondary | ICD-10-CM | POA: Diagnosis not present

## 2022-03-31 DIAGNOSIS — Z79890 Hormone replacement therapy (postmenopausal): Secondary | ICD-10-CM | POA: Diagnosis not present

## 2022-03-31 DIAGNOSIS — F0390 Unspecified dementia without behavioral disturbance: Secondary | ICD-10-CM | POA: Insufficient documentation

## 2022-03-31 DIAGNOSIS — E039 Hypothyroidism, unspecified: Secondary | ICD-10-CM | POA: Diagnosis not present

## 2022-03-31 DIAGNOSIS — R42 Dizziness and giddiness: Secondary | ICD-10-CM | POA: Diagnosis not present

## 2022-03-31 DIAGNOSIS — R531 Weakness: Secondary | ICD-10-CM | POA: Diagnosis not present

## 2022-03-31 LAB — URINALYSIS, ROUTINE W REFLEX MICROSCOPIC
Bilirubin Urine: NEGATIVE
Glucose, UA: NEGATIVE mg/dL
Hgb urine dipstick: NEGATIVE
Ketones, ur: 5 mg/dL — AB
Leukocytes,Ua: NEGATIVE
Nitrite: NEGATIVE
Protein, ur: NEGATIVE mg/dL
Specific Gravity, Urine: 1.017 (ref 1.005–1.030)
pH: 6 (ref 5.0–8.0)

## 2022-03-31 LAB — COMPREHENSIVE METABOLIC PANEL
ALT: 10 U/L (ref 0–44)
AST: 15 U/L (ref 15–41)
Albumin: 3.5 g/dL (ref 3.5–5.0)
Alkaline Phosphatase: 66 U/L (ref 38–126)
Anion gap: 8 (ref 5–15)
BUN: 14 mg/dL (ref 8–23)
CO2: 29 mmol/L (ref 22–32)
Calcium: 8.3 mg/dL — ABNORMAL LOW (ref 8.9–10.3)
Chloride: 102 mmol/L (ref 98–111)
Creatinine, Ser: 0.84 mg/dL (ref 0.61–1.24)
GFR, Estimated: >60 mL/min (ref >60)
Glucose, Bld: 78 mg/dL (ref 70–99)
Potassium: 4.1 mmol/L (ref 3.5–5.1)
Sodium: 139 mmol/L (ref 135–145)
Total Bilirubin: 0.3 mg/dL (ref 0.3–1.2)
Total Protein: 6.6 g/dL (ref 6.5–8.1)

## 2022-03-31 LAB — CBC WITH DIFFERENTIAL/PLATELET
Abs Immature Granulocytes: 0.02 10*3/uL (ref 0.00–0.07)
Basophils Absolute: 0 10*3/uL (ref 0.0–0.1)
Basophils Relative: 0 %
Eosinophils Absolute: 0 10*3/uL (ref 0.0–0.5)
Eosinophils Relative: 0 %
HCT: 38.9 % — ABNORMAL LOW (ref 39.0–52.0)
Hemoglobin: 12.8 g/dL — ABNORMAL LOW (ref 13.0–17.0)
Immature Granulocytes: 0 %
Lymphocytes Relative: 7 %
Lymphs Abs: 0.6 10*3/uL — ABNORMAL LOW (ref 0.7–4.0)
MCH: 30.8 pg (ref 26.0–34.0)
MCHC: 32.9 g/dL (ref 30.0–36.0)
MCV: 93.5 fL (ref 80.0–100.0)
Monocytes Absolute: 0.6 10*3/uL (ref 0.1–1.0)
Monocytes Relative: 7 %
Neutro Abs: 7.4 10*3/uL (ref 1.7–7.7)
Neutrophils Relative %: 86 %
Platelets: 185 10*3/uL (ref 150–400)
RBC: 4.16 MIL/uL — ABNORMAL LOW (ref 4.22–5.81)
RDW: 13.6 % (ref 11.5–15.5)
WBC: 8.6 10*3/uL (ref 4.0–10.5)
nRBC: 0 % (ref 0.0–0.2)

## 2022-03-31 LAB — TSH: TSH: 18.956 u[IU]/mL — ABNORMAL HIGH (ref 0.350–4.500)

## 2022-03-31 LAB — TROPONIN I (HIGH SENSITIVITY)
Troponin I (High Sensitivity): 4 ng/L (ref <18)
Troponin I (High Sensitivity): 3 ng/L (ref <18)

## 2022-03-31 LAB — RESP PANEL BY RT-PCR (RSV, FLU A&B, COVID)  RVPGX2
Influenza A by PCR: NEGATIVE
Influenza B by PCR: NEGATIVE
Resp Syncytial Virus by PCR: NEGATIVE
SARS Coronavirus 2 by RT PCR: NEGATIVE

## 2022-03-31 LAB — CBG MONITORING, ED: Glucose-Capillary: 86 mg/dL (ref 70–99)

## 2022-03-31 LAB — MAGNESIUM: Magnesium: 2.2 mg/dL (ref 1.7–2.4)

## 2022-03-31 NOTE — Discharge Instructions (Signed)
Thank you for coming to [facility] Emergency Department. You were seen for syncope (passing out). We did an exam, labs, and imaging, and these showed elevated TSH, an indication of hypothyroidism. You are supposed to be taking levothyroxine at home, please take that medication as prescribed. Please stay well hydrated at home. Please follow up with your primary care provider within 1-2 weeks.   Do not hesitate to return to the ED or call 911 if you experience: -Worsening symptoms -Chest pain, shortness of breath -Palpitations -Lightheadedness, passing out -Fevers/chills -Anything else that concerns you

## 2022-03-31 NOTE — ED Provider Notes (Signed)
[facility] Provider Note   CSN: [id number] Arrival date & time: 03/31/22  1847     History  Chief Complaint  Patient presents with   syncope episode    Dennis Daniel is a 77 y.o. male with mild dementia, thyroid abnormality, otherwise no known PMH presents with syncope.  Patient presents with son who provides additional history.  Son states that patient was at a [place] with his grandson when he had a syncopal episode in the bathroom while having a bowel movement and then again when he stood up. Had lightheadedness at the time. Yolanda Bonine was with him at the time and so noted that he did not hit his head or have any seizure-like activity.  Son states that patient has a thyroid medication that he is supposed to be taking but does not, is unsure what it is.  Was also supposed to be taking Aricept and B12 but does not take either of those things.  Otherwise has no known past medical history.  Patient states that he has no pain at this time, no headache vision changes, trouble speaking, trouble swallowing, asymmetric weakness, numbness tingling, chest pain, palpitations, shortness of breath, nausea vomiting diarrhea constipation, urinary symptoms, lower extreme edema, melena, hematochezia.  He otherwise feels in his normal state of health and would like to be discharged.  Has never had any syncope before.  Has been eating and drinking well the last couple of days, no recent illnesses.  Does not feel lightheaded currently.    HPI     Home Medications Prior to Admission medications   Medication Sig Start Date End Date Taking? Authorizing Provider  Cyanocobalamin (VITAMIN B-12 CR PO) Take by mouth.   Yes [provider]  levothyroxine (SYNTHROID) 137 MCG tablet Take 137 mcg by mouth daily. 03/29/22  Yes [provider]  donepezil (ARICEPT) 5 MG tablet Take 5 mg by mouth every morning. Patient not taking: Reported on  03/31/2022 03/22/22   [provider]  levothyroxine (SYNTHROID) 125 MCG tablet Take 125 mcg by mouth daily. Patient not taking: Reported on 03/31/2022 03/22/22   [provider]      Allergies    Patient has no known allergies.    Review of Systems   Review of Systems Review of systems Negative for recent illnesses, f/c.  A 10 point review of systems was performed and is negative unless otherwise reported in HPI.  Physical Exam Updated Vital Signs BP 115/67   Pulse (!) 54   Temp (!) 97.5 F (36.4 C) (Oral)   Resp 16   Ht 6' (1.829 m)   Wt 68.5 kg   SpO2 94%   BMI 20.48 kg/m  Physical Exam General: Normal appearing male, lying in bed.  HEENT: NCAT, no midline c-spine tenderness to palpation, PERRLA, EOMI, Sclera anicteric, MMM, trachea midline. Everted L lower lid that patient states is baseline.  Cardiology: RRR, no murmurs/rubs/gallops. BL radial and DP pulses equal bilaterally.  Resp: Normal respiratory rate and effort. CTAB, no wheezes, rhonchi, crackles.  Abd: Soft, non-tender, non-distended. No rebound tenderness or guarding.  GU: Deferred. MSK: No peripheral edema or signs of trauma. Extremities without deformity or TTP. No cyanosis or clubbing. Skin: warm, dry. No rashes or lesions. Back: No CVA tenderness Neuro: A&Ox4, CNs II-XII grossly intact. 5/5 strength all four extremities. Sensation grossly intact. Tongue protrudes midline. Normal speech.  Psych: Normal mood and affect.   ED Results / Procedures /  Treatments   Labs (all labs ordered are listed, but only abnormal results are displayed) Labs Reviewed  CBC WITH DIFFERENTIAL/PLATELET - Abnormal; Notable for the following components:      Result Value   RBC 4.16 (*)    Hemoglobin 12.8 (*)    HCT 38.9 (*)    Lymphs Abs 0.6 (*)    All other components within normal limits  COMPREHENSIVE METABOLIC PANEL - Abnormal; Notable for the following components:   Calcium 8.3 (*)    All other components  within normal limits  TSH - Abnormal; Notable for the following components:   TSH 18.956 (*)    All other components within normal limits  URINALYSIS, ROUTINE W REFLEX MICROSCOPIC - Abnormal; Notable for the following components:   Ketones, ur 5 (*)    All other components within normal limits  RESP PANEL BY RT-PCR (RSV, FLU A&B, COVID)  RVPGX2  MAGNESIUM  T4, FREE  CBG MONITORING, ED  TROPONIN I (HIGH SENSITIVITY)  TROPONIN I (HIGH SENSITIVITY)    EKG EKG Interpretation  Date/Time:  Wednesday March 31 2022 20:29:59 EST Ventricular Rate:  56 PR Interval:  124 QRS Duration: 102 QT Interval:  426 QTC Calculation: 412 R Axis:   77 Text Interpretation: Sinus rhythm Atrial premature complex Confirmed by Cindee Lame [phone]) on 03/31/2022 8:35:17 PM  Radiology No results found.  Procedures Procedures    Medications Ordered in ED Medications - No data to display  ED Course/ Medical Decision Making/ A&P                          Medical Decision Making Amount and/or Complexity of Data Reviewed Labs: ordered. Decision-making details documented in ED Course.    This patient presents to the ED for concern of syncope, this involves an extensive number of treatment options, and is a complaint that carries with it a high risk of complications and morbidity.  I considered the following differential and admission for this acute, potentially life threatening condition.   MDM:    DDX for syncope includes but is not limited to:  Consider anemia, hypo-/hyperglycemia, and electrolyte abnormalities as possible etiologies. Consider arrhythmias and ACS, although without associated symptoms, less likely. Consider hemorrhage vs CVA, although neuro intact now for 24 hours and no associated other symptoms.  Was having a bowel movement at the time and did feel lightheaded predisposing to vasovagal syncope. He did not have any head trauma and is overall acting at baseline per son at bedside  and low concern for intracranial process..  Considered PE as well, although without hypoxia or sob, and no signs of DVT on exam so also unlikely.  EKG does not demonstrate any arrhythmias or signs of ischemia.  He had no seizure-like activity and has had no history of seizures.  He has no focal neurodeficits to suggest CVA.  Patient reportedly is supposed to be taking her thyroid medication so consider thyroid abnormalities as possible cause however patient is vitally stable and feels well at this time, if he had significant thyroid abnormality would expect there to be vital sign changes consider broad differential.  Clinical Course as of 03/31/22 2255  Wed Mar 31, 2022  2034 Hemoglobin(!): 12.8 Last value was greater than 13 but 11 years ago [HN]  2034 WBC: 8.6 No leukocytosis [HN]  2151 Resp panel by RT-PCR (RSV, Flu A&B, Covid) Anterior Nasal Swab Neg [HN]  2151 Urinalysis, Routine w reflex microscopic -Urine, Clean CatchMarland Kitchen)  Clear [HN]  2151 Magnesium: 2.2 [HN]  2152 Glucose-Capillary: 86 [HN]  2152 Troponin I (High Sensitivity): 4 [HN]  2152 Comprehensive metabolic panel(!) wnl [HN]  2156 TSH(!): 18.956 [HN]  2157 Patient with elevated TSH. He has no bradycardia documented here, no lightheadedness. Possible this contributed to his syncope.  [HN]  2215 Troponin I (High Sensitivity): 3 [HN]  5427 Patient's w/u only significant for elevated TSH 18. Patient feels well and at his baseline. Orthostatics unremarkable and patient does not feel lightheaded. D/w patient and his son. He was noted to be mildly bradycardic while here in ED but is asymptomatic from it. Possible that hypothyroidism is contributing to his symptoms or that bradycardia could have contributed to his syncope earlier. I relayed that he needs to be evaluated by his primary care physician within the next week and that he should take the medications at home he was prescribed.  [HN]    Clinical Course User Index [HN] Audley Hose, MD    Labs: I Ordered, and personally interpreted labs.  The pertinent results include: Those listed above   Additional history obtained from son at bedside.   Cardiac Monitoring: The patient was maintained on a cardiac monitor.  I personally viewed and interpreted the cardiac monitored which showed an underlying rhythm of: Normal sinus rhythm  Reevaluation: After the interventions noted above, I reevaluated the patient and found that they have :stayed the same  Social Determinants of Health: Patient lives independently   Disposition:  [place] syncope rule low risk. W/u shows TSH 18. Instructed to take levothyroxine. Possible vasovagal syncope given bowel movement vs possible contributing hypothyroidism/bradycardic episode during straining. DC w/ DC instructions/return precautions, all questions answered to patient's and son's satisfaction   Co morbidities that complicate the patient evaluation History reviewed. No pertinent past medical history.   Medicines No orders of the defined types were placed in this encounter.   I have reviewed the patients home medicines and have made adjustments as needed  Problem List / ED Course: Problem List Items Addressed This Visit   None Visit Diagnoses     Syncope, unspecified syncope type    -  Primary   Hypothyroidism, unspecified type       Relevant Medications   levothyroxine (SYNTHROID) 125 MCG tablet   levothyroxine (SYNTHROID) 137 MCG tablet                   This note was created using dictation software, which may contain spelling or grammatical errors.    Audley Hose, MD 03/31/22 2258

## 2022-03-31 NOTE — ED Triage Notes (Signed)
Pt. BIB [facility]. Pt. Was at the [place] and had a syncope episode in the bathroom on the commode and then again when he stood up, son told EMS he was unconscious for "about a minute" Pt. Did not hit his head and complains of no pain. Pt. Does not really have a medical history and does not take any home meds. CBG with EMS was 126 and B/P 95/62 with HR in 50's.

## 2022-04-01 LAB — T4, FREE: Free T4: 0.57 ng/dL — ABNORMAL LOW (ref 0.61–1.12)

## 2022-04-05 DIAGNOSIS — Z20828 Contact with and (suspected) exposure to other viral communicable diseases: Secondary | ICD-10-CM | POA: Diagnosis not present

## 2022-04-12 ENCOUNTER — Telehealth: Payer: Self-pay

## 2022-04-12 DIAGNOSIS — Z20828 Contact with and (suspected) exposure to other viral communicable diseases: Secondary | ICD-10-CM | POA: Diagnosis not present

## 2022-04-12 NOTE — Telephone Encounter (Signed)
        Patient  visited [facility] on 03/31/2022  for syncope.   Telephone encounter attempt :  1st  Unable to leave message voicemail not setup.   [facility] Resource Care Guide   ??[email or web address]  ?? [id number]   Website: [email or web address]  [email or web address]

## 2022-04-19 DIAGNOSIS — Z20828 Contact with and (suspected) exposure to other viral communicable diseases: Secondary | ICD-10-CM | POA: Diagnosis not present

## 2022-04-20 ENCOUNTER — Telehealth: Payer: Self-pay

## 2022-04-20 NOTE — Telephone Encounter (Signed)
        Patient  visited [facility] on 03/31/2022  for syncope.   Telephone encounter attempt :  2nd  Unable to leave message voicemail not setup.   [facility] Resource Care Guide   ??[email or web address]  ?? [id number]   Website: [email or web address]  [email or web address]

## 2022-04-21 ENCOUNTER — Telehealth: Payer: Self-pay

## 2022-04-21 NOTE — Telephone Encounter (Signed)
        Patient  visited [facility] on 03/31/2022  for syncope.   Telephone encounter attempt :  3rd  Unable to leave message voicemail not setup.   [facility] Resource Care Guide   [email or web address]  ?? [phone]   Website: [email or web address]  [email or web address]

## 2022-04-26 DIAGNOSIS — Z20828 Contact with and (suspected) exposure to other viral communicable diseases: Secondary | ICD-10-CM | POA: Diagnosis not present

## 2022-05-03 DIAGNOSIS — Z20828 Contact with and (suspected) exposure to other viral communicable diseases: Secondary | ICD-10-CM | POA: Diagnosis not present

## 2022-05-10 DIAGNOSIS — Z20828 Contact with and (suspected) exposure to other viral communicable diseases: Secondary | ICD-10-CM | POA: Diagnosis not present

## 2022-05-17 DIAGNOSIS — Z20828 Contact with and (suspected) exposure to other viral communicable diseases: Secondary | ICD-10-CM | POA: Diagnosis not present

## 2022-05-24 DIAGNOSIS — Z20828 Contact with and (suspected) exposure to other viral communicable diseases: Secondary | ICD-10-CM | POA: Diagnosis not present

## 2022-05-31 DIAGNOSIS — Z20828 Contact with and (suspected) exposure to other viral communicable diseases: Secondary | ICD-10-CM | POA: Diagnosis not present

## 2022-06-07 DIAGNOSIS — Z20828 Contact with and (suspected) exposure to other viral communicable diseases: Secondary | ICD-10-CM | POA: Diagnosis not present

## 2022-06-14 DIAGNOSIS — Z20828 Contact with and (suspected) exposure to other viral communicable diseases: Secondary | ICD-10-CM | POA: Diagnosis not present

## 2022-06-28 DIAGNOSIS — Z20828 Contact with and (suspected) exposure to other viral communicable diseases: Secondary | ICD-10-CM | POA: Diagnosis not present

## 2022-07-05 DIAGNOSIS — Z20828 Contact with and (suspected) exposure to other viral communicable diseases: Secondary | ICD-10-CM | POA: Diagnosis not present

## 2022-07-12 DIAGNOSIS — Z20828 Contact with and (suspected) exposure to other viral communicable diseases: Secondary | ICD-10-CM | POA: Diagnosis not present

## 2022-07-19 DIAGNOSIS — Z20828 Contact with and (suspected) exposure to other viral communicable diseases: Secondary | ICD-10-CM | POA: Diagnosis not present

## 2022-07-26 DIAGNOSIS — Z20828 Contact with and (suspected) exposure to other viral communicable diseases: Secondary | ICD-10-CM | POA: Diagnosis not present

## 2022-08-02 DIAGNOSIS — Z20828 Contact with and (suspected) exposure to other viral communicable diseases: Secondary | ICD-10-CM | POA: Diagnosis not present

## 2022-08-09 DIAGNOSIS — Z20828 Contact with and (suspected) exposure to other viral communicable diseases: Secondary | ICD-10-CM | POA: Diagnosis not present

## 2022-08-16 DIAGNOSIS — Z20828 Contact with and (suspected) exposure to other viral communicable diseases: Secondary | ICD-10-CM | POA: Diagnosis not present

## 2022-08-24 DIAGNOSIS — Z20828 Contact with and (suspected) exposure to other viral communicable diseases: Secondary | ICD-10-CM | POA: Diagnosis not present

## 2022-08-31 DIAGNOSIS — Z20828 Contact with and (suspected) exposure to other viral communicable diseases: Secondary | ICD-10-CM | POA: Diagnosis not present

## 2022-09-07 DIAGNOSIS — Z20828 Contact with and (suspected) exposure to other viral communicable diseases: Secondary | ICD-10-CM | POA: Diagnosis not present

## 2022-09-14 DIAGNOSIS — Z20828 Contact with and (suspected) exposure to other viral communicable diseases: Secondary | ICD-10-CM | POA: Diagnosis not present

## 2023-01-18 IMAGING — MR MR HEAD W/O CM
11 of 12 series · 41 of 48 positions shown · non-contrast
Comparison: None Available.

CLINICAL DATA: Memory loss

EXAM:
MRI HEAD WITHOUT CONTRAST
TECHNIQUE: Multiplanar, multiecho pulse sequences of the brain and surrounding
structures were obtained without intravenous contrast.

[Series 5: DWI · axial · 4.0mm · 0.88mm/px · z∈[-85,+60]mm · 4 of 38 slices shown (1 of 6)]
[im 1/38]
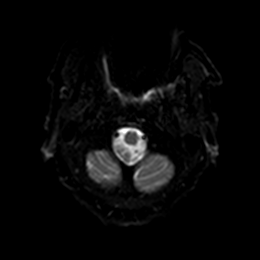
[im 13/38]
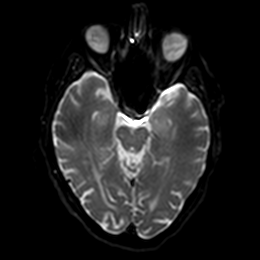
[im 25/38]
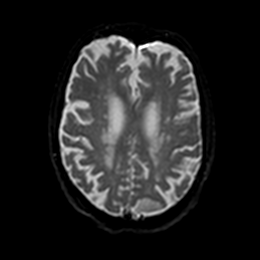
[im 38/38]
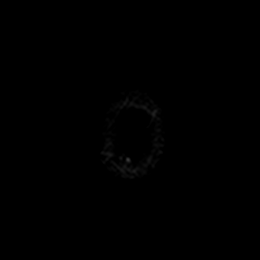

[Series 5: DWI · axial · 4.0mm · 0.88mm/px · z∈[-85,+60]mm · 5 of 38 slices shown (2 of 6)]
[im 1/38]
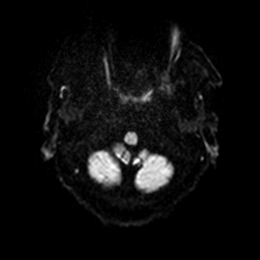
[im 10/38]
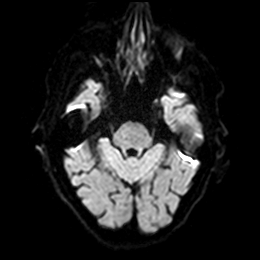
[im 19/38]
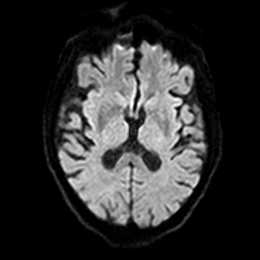
[im 28/38]
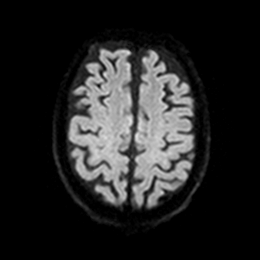
[im 38/38]
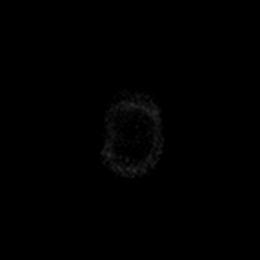

[Series 6: DWI · axial · 4.0mm · 0.88mm/px · z∈[-85,+60]mm · 5 of 38 slices shown (3 of 6)]
[im 1/38]
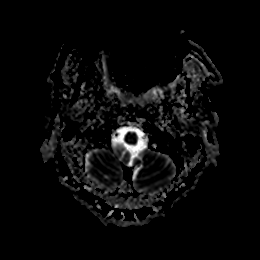
[im 10/38]
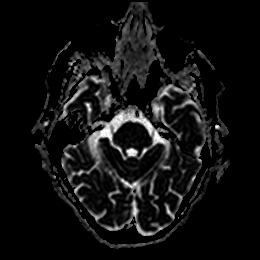
[im 19/38]
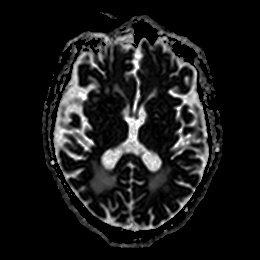
[im 28/38]
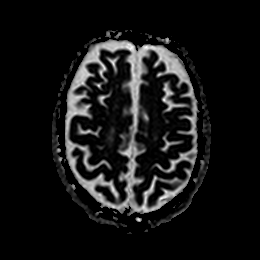
[im 38/38]
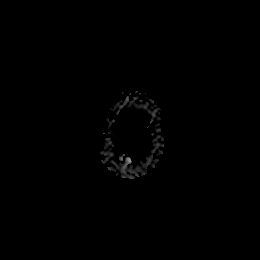

[Series 7: DWI · coronal · 5.0mm · 0.88mm/px · 4 of 30 slices shown (4 of 6)]
[im 1/30]
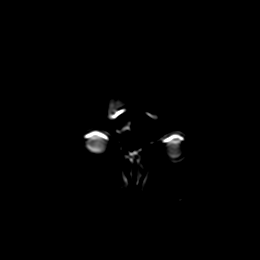
[im 10/30]
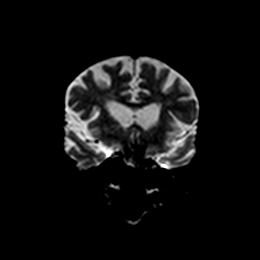
[im 20/30]
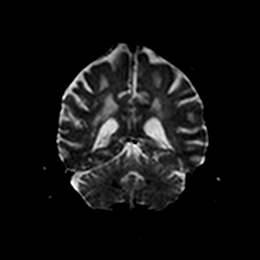
[im 30/30]
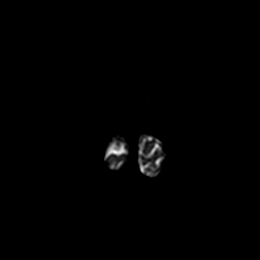

[Series 7: DWI · coronal · 5.0mm · 0.88mm/px · 4 of 30 slices shown (5 of 6)]
[im 1/30]
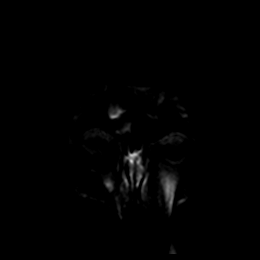
[im 10/30]
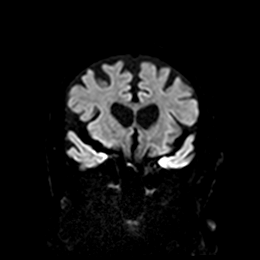
[im 20/30]
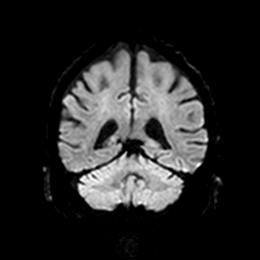
[im 30/30]
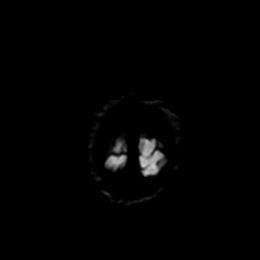

[Series 8: DWI · coronal · 5.0mm · 0.88mm/px · 4 of 30 slices shown (6 of 6)]
[im 1/30]
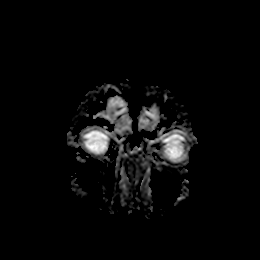
[im 10/30]
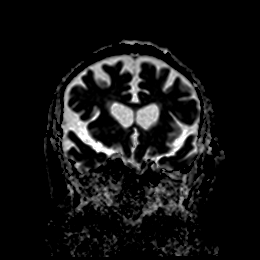
[im 20/30]
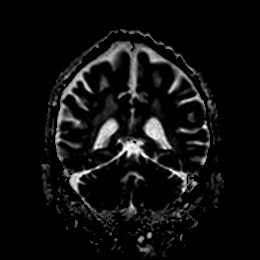
[im 30/30]
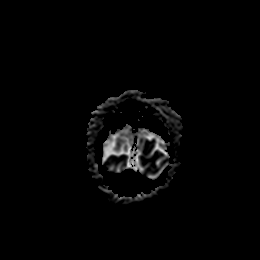

[Series 9: T1 · sagittal · 5.0mm · 0.94mm/px · 3 of 21 slices shown]
[im 1/21]
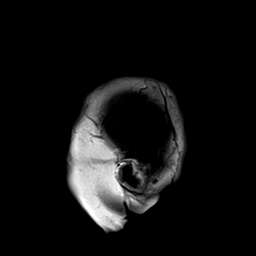
[im 11/21]
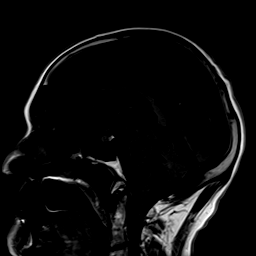
[im 21/21]
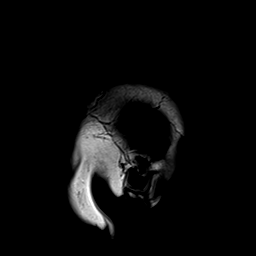

[Series 10: T2 · axial · 5.0mm · 0.72mm/px · z∈[-77,+52]mm · 2 of 20 slices shown (1 of 2)]
[im 1/20]
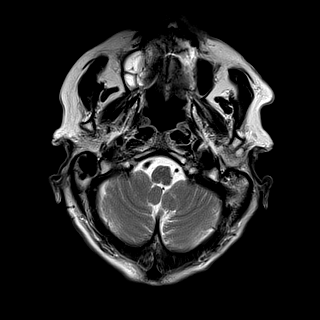
[im 20/20]
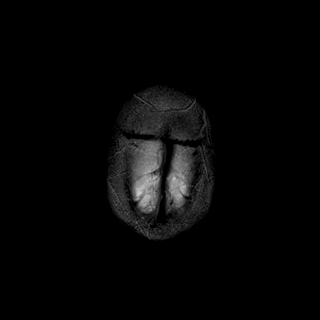

[Series 11: ax hemo · axial · 5.0mm · 0.86mm/px · z∈[-79,+61]mm · 3 of 25 slices shown]
[im 1/25]
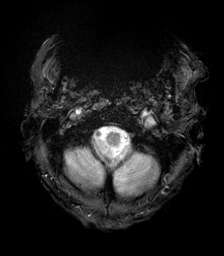
[im 13/25]
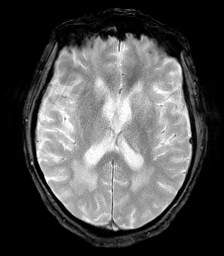
[im 25/25]
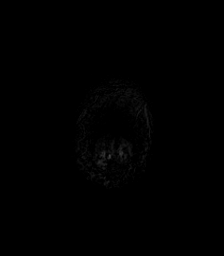

[Series 12: FLAIR · axial · 4.0mm · 0.43mm/px · z∈[-70,+51]mm · 4 of 32 slices shown]
[im 1/32]
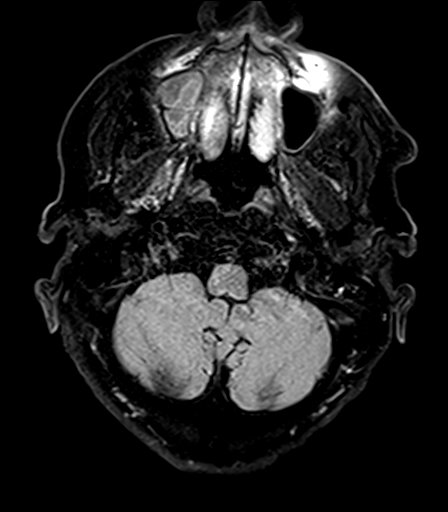
[im 11/32]
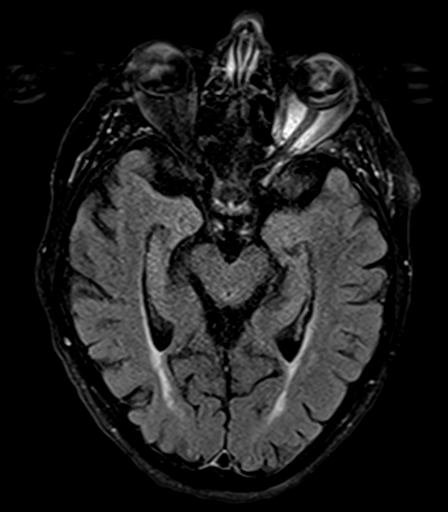
[im 21/32]
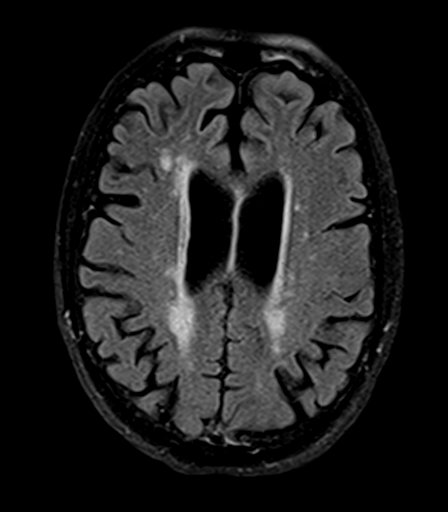
[im 32/32]
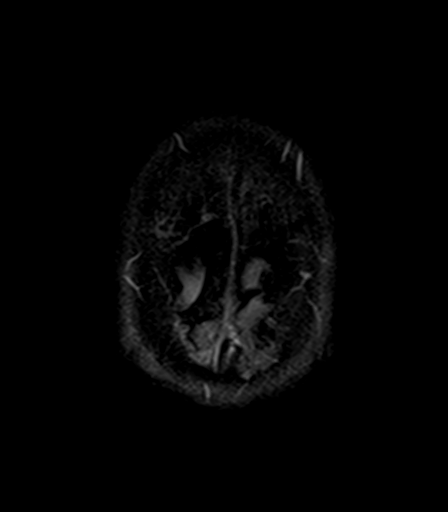

[Series 14: T2 · coronal · 5.0mm · 0.72mm/px · 3 of 28 slices shown (2 of 2)]
[im 1/28]
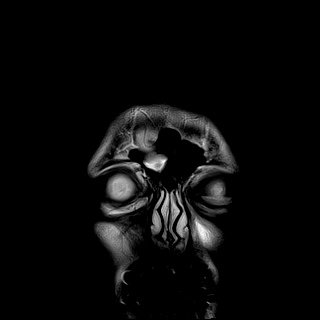
[im 14/28]
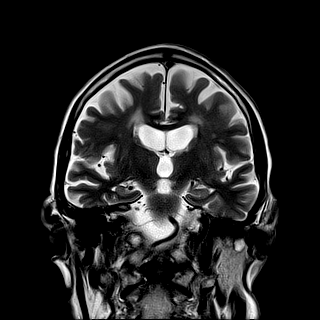
[im 28/28]
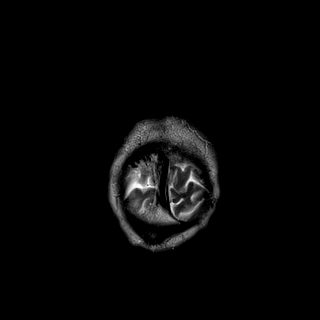

[41 of 48 positions shown; findings below may reference images not displayed]

FINDINGS: Brain: No restricted diffusion to suggest acute or subacute infarct.
No acute hemorrhage, mass, mass effect, or midline shift. No
hydrocephalus or extra-axial collection. No hemosiderin deposition
to suggest remote hemorrhage or superficial siderosis.

Scattered and confluent T2 hyperintense signal in the
periventricular white matter, likely the sequela of mild-to-moderate
chronic small vessel ischemic disease. Degree of cerebral atrophy is
mildly advanced for age, which appears slightly more pronounced in
the right temporal lobe, with slightly asymmetric widening of the
right sylvian fissure.

Vascular: Normal flow voids.

Skull and upper cervical spine: Normal marrow signal.

Sinuses/Orbits: Mucosal thickening in the right maxillary and
frontal sinus. Status post bilateral lens replacements.

Other: Trace fluid in left mastoid air cells.
IMPRESSION: 1. Mildly advanced cerebral atrophy for age, which appears slightly
more pronounced in the right temporal lobe.
2.  No acute intracranial process.

## 2023-01-19 ENCOUNTER — Encounter (HOSPITAL_COMMUNITY): Payer: Self-pay

## 2023-01-19 ENCOUNTER — Observation Stay (HOSPITAL_COMMUNITY)
Admission: EM | Admit: 2023-01-19 | Discharge: 2023-01-20 | Disposition: A | Discharge status: Home / Self Care | Payer: Medicare PPO | Attending: Internal Medicine | Admitting: Internal Medicine

## 2023-01-19 ENCOUNTER — Other Ambulatory Visit: Payer: Self-pay

## 2023-01-19 ENCOUNTER — Emergency Department (HOSPITAL_COMMUNITY): Payer: Medicare PPO

## 2023-01-19 DIAGNOSIS — J439 Emphysema, unspecified: Secondary | ICD-10-CM | POA: Diagnosis not present

## 2023-01-19 DIAGNOSIS — E860 Dehydration: Secondary | ICD-10-CM

## 2023-01-19 DIAGNOSIS — Z79899 Other long term (current) drug therapy: Secondary | ICD-10-CM | POA: Diagnosis not present

## 2023-01-19 DIAGNOSIS — Z87891 Personal history of nicotine dependence: Secondary | ICD-10-CM | POA: Diagnosis not present

## 2023-01-19 DIAGNOSIS — E871 Hypo-osmolality and hyponatremia: Secondary | ICD-10-CM | POA: Diagnosis not present

## 2023-01-19 DIAGNOSIS — I959 Hypotension, unspecified: Secondary | ICD-10-CM | POA: Diagnosis not present

## 2023-01-19 DIAGNOSIS — E039 Hypothyroidism, unspecified: Secondary | ICD-10-CM | POA: Diagnosis not present

## 2023-01-19 DIAGNOSIS — I771 Stricture of artery: Secondary | ICD-10-CM | POA: Diagnosis not present

## 2023-01-19 DIAGNOSIS — R55 Syncope and collapse: Secondary | ICD-10-CM | POA: Diagnosis not present

## 2023-01-19 DIAGNOSIS — I700 Atherosclerosis of aorta: Secondary | ICD-10-CM | POA: Diagnosis not present

## 2023-01-19 DIAGNOSIS — R001 Bradycardia, unspecified: Secondary | ICD-10-CM | POA: Diagnosis not present

## 2023-01-19 LAB — BASIC METABOLIC PANEL
Anion gap: 6 (ref 5–15)
BUN: 11 mg/dL (ref 8–23)
CO2: 26 mmol/L (ref 22–32)
Calcium: 8.3 mg/dL — ABNORMAL LOW (ref 8.9–10.3)
Chloride: 101 mmol/L (ref 98–111)
Creatinine, Ser: 0.96 mg/dL (ref 0.61–1.24)
GFR, Estimated: >60 mL/min (ref >60)
Glucose, Bld: 144 mg/dL — ABNORMAL HIGH (ref 70–99)
Potassium: 3.7 mmol/L (ref 3.5–5.1)
Sodium: 133 mmol/L — ABNORMAL LOW (ref 135–145)

## 2023-01-19 LAB — CBC
HCT: 39.1 % (ref 39.0–52.0)
Hemoglobin: 12.9 g/dL — ABNORMAL LOW (ref 13.0–17.0)
MCH: 30.1 pg (ref 26.0–34.0)
MCHC: 33 g/dL (ref 30.0–36.0)
MCV: 91.4 fL (ref 80.0–100.0)
Platelets: 167 10*3/uL (ref 150–400)
RBC: 4.28 MIL/uL (ref 4.22–5.81)
RDW: 13.4 % (ref 11.5–15.5)
WBC: 3.9 10*3/uL — ABNORMAL LOW (ref 4.0–10.5)
nRBC: 0 % (ref 0.0–0.2)

## 2023-01-19 LAB — TROPONIN I (HIGH SENSITIVITY)
Troponin I (High Sensitivity): 4 ng/L (ref <18)
Troponin I (High Sensitivity): 4 ng/L (ref <18)

## 2023-01-19 LAB — URINALYSIS, ROUTINE W REFLEX MICROSCOPIC
Bilirubin Urine: NEGATIVE
Glucose, UA: NEGATIVE mg/dL
Hgb urine dipstick: NEGATIVE
Ketones, ur: NEGATIVE mg/dL
Leukocytes,Ua: NEGATIVE
Nitrite: NEGATIVE
Protein, ur: NEGATIVE mg/dL
Specific Gravity, Urine: 1.006 (ref 1.005–1.030)
pH: 7 (ref 5.0–8.0)

## 2023-01-19 LAB — MAGNESIUM: Magnesium: 2.2 mg/dL (ref 1.7–2.4)

## 2023-01-19 LAB — CBG MONITORING, ED: Glucose-Capillary: 87 mg/dL (ref 70–99)

## 2023-01-19 LAB — TSH: TSH: 1.432 u[IU]/mL (ref 0.350–4.500)

## 2023-01-19 MED ORDER — ONDANSETRON HCL 4 MG PO TABS: 4.0000 mg | ORAL_TABLET | Freq: Four times a day (QID) | ORAL | Status: DC | PRN

## 2023-01-19 MED ORDER — ACETAMINOPHEN 650 MG RE SUPP: 650.0000 mg | Freq: Four times a day (QID) | RECTAL | Status: DC | PRN

## 2023-01-19 MED ORDER — LEVOTHYROXINE SODIUM 137 MCG PO TABS
137.0000 ug | ORAL_TABLET | Freq: Every day | ORAL | Status: DC
  Administered 2023-01-20: 137 ug via ORAL
  Filled 2023-01-19: qty 1

## 2023-01-19 MED ORDER — POTASSIUM CHLORIDE IN NACL 20-0.9 MEQ/L-% IV SOLN: INTRAVENOUS | Status: DC

## 2023-01-19 MED ORDER — ENOXAPARIN SODIUM 40 MG/0.4ML IJ SOSY
40.0000 mg | PREFILLED_SYRINGE | INTRAMUSCULAR | Status: DC
  Administered 2023-01-20: 40 mg via SUBCUTANEOUS
  Filled 2023-01-19: qty 0.4

## 2023-01-19 MED ORDER — POLYETHYLENE GLYCOL 3350 17 G PO PACK: 17.0000 g | PACK | Freq: Every day | ORAL | Status: DC | PRN

## 2023-01-19 MED ORDER — ONDANSETRON HCL 4 MG/2ML IJ SOLN: 4.0000 mg | Freq: Four times a day (QID) | INTRAMUSCULAR | Status: DC | PRN

## 2023-01-19 MED ORDER — ACETAMINOPHEN 325 MG PO TABS: 650.0000 mg | ORAL_TABLET | Freq: Four times a day (QID) | ORAL | Status: DC | PRN

## 2023-01-19 NOTE — ED Notes (Signed)
Pt has been changed and placed in a gown. Connected to monitor and resting at this time.

## 2023-01-19 NOTE — ED Notes (Signed)
Portable xray at bedside.

## 2023-01-19 NOTE — Assessment & Plan Note (Addendum)
Syncopal episode without prodrome.  EKG unchanged, troponin 4.  EMS reports blood pressure down to 60/40.  No suggestion of vasovagal etiology at this time, or explanation for hypotension.  No GI losses, poor oral intake.  Chest x-ray negative for acute abnormality. - N/s + 20 kcl 100cc/hr x 20hrs -Obtain echocardiogram -Trend troponin -Check magnesium, TSH -May need to consider outpatient cardiac monitoring

## 2023-01-19 NOTE — ED Triage Notes (Signed)
[facility] from home. Pt was eating dinner at a restaurant when he passed out. He has a history of it due to hypotension  60/40 108/60 18 l ac. ns given

## 2023-01-19 NOTE — Assessment & Plan Note (Signed)
Resume Synthroid ?

## 2023-01-19 NOTE — ED Provider Notes (Signed)
[place] EMERGENCY DEPARTMENT AT [facility] Provider Note  CSN: [id number] Arrival date & time: 01/19/23 1918  Chief Complaint(s) Loss of Consciousness  HPI Dennis Daniel is a 77 y.o. male with history of hypothyroidism presenting to the emergency department with episode of syncope.  Patient was at a restaurant with his family, suddenly said "I do not feel good", slumped over.  He was unconscious for some period, helped by family to the ground.  When he woke up, he was pretty much back at baseline although still weak.  No choking.  He had not just stood up.  No chest pain, palpitations.  No shaking activity.  He did have an episode of bladder incontinence.  No tongue biting.  Paramedics checked his blood pressure and it was low.  He was taken to the emergency department.  Currently he reports he feels well and has no focal complaints such as headaches, nausea, vomiting, chest pain, abdominal pain, fevers, chills, or any other new symptoms.   Past Medical History History reviewed. No pertinent past medical history. Patient Active Problem List   Diagnosis Date Noted   Syncope 01/19/2023   Home Medication(s) Prior to Admission medications   Medication Sig Start Date End Date Taking? Authorizing Provider  donepezil (ARICEPT) 5 MG tablet Take 5 mg by mouth every morning. 03/22/22  Yes [provider]  levothyroxine (SYNTHROID) 137 MCG tablet Take 137 mcg by mouth daily. 03/29/22  Yes [provider]                                                                                                                                    Past Surgical History Past Surgical History:  Procedure Laterality Date   CATARACT EXTRACTION     left-[facility]   CATARACT EXTRACTION W/PHACO  02/25/2011   Procedure: CATARACT EXTRACTION PHACO AND INTRAOCULAR LENS PLACEMENT (IOC);  Surgeon: Gemma Payor;  Location: [facility];  Service: Ophthalmology;  Laterality: Right;  CDE: 10.71   TONSILLECTOMY      aschild   Family History Family History  Problem Relation Age of Onset   Anesthesia problems Neg Hx    Hypotension Neg Hx    Malignant hyperthermia Neg Hx    Pseudochol deficiency Neg Hx     Social History Social History   Tobacco Use   Smoking status: Former    Current packs/day: 0.00    Average packs/day: 2.0 packs/day for 40.0 years (80.0 ttl pk-yrs)    Types: Cigarettes    Start date: 02/17/1966    Quit date: 02/17/2006    Years since quitting: 16.9  Substance Use Topics   Alcohol use: No   Drug use: No   Allergies Patient has no known allergies.  Review of Systems Review of Systems  All other systems reviewed and are negative.   Physical Exam Vital Signs  I have reviewed the triage vital signs BP 106/65 (BP Location:  Left Arm)   Pulse 62   Temp 98.4 F (36.9 C)   Resp 17   Ht 6' (1.829 m)   Wt 68.5 kg   SpO2 99%   BMI 20.48 kg/m  Physical Exam Vitals and nursing note reviewed.  Constitutional:      General: He is not in acute distress.    Appearance: Normal appearance.  HENT:     Mouth/Throat:     Mouth: Mucous membranes are moist.  Eyes:     Conjunctiva/sclera: Conjunctivae normal.  Cardiovascular:     Rate and Rhythm: Normal rate and regular rhythm.  Pulmonary:     Effort: Pulmonary effort is normal. No respiratory distress.     Breath sounds: Normal breath sounds.  Abdominal:     General: Abdomen is flat.     Palpations: Abdomen is soft.     Tenderness: There is no abdominal tenderness.  Musculoskeletal:     Right lower leg: No edema.     Left lower leg: No edema.  Skin:    General: Skin is warm and dry.     Capillary Refill: Capillary refill takes less than 2 seconds.  Neurological:     Mental Status: He is alert and oriented to person, place, and time. Mental status is at baseline.  Psychiatric:        Mood and Affect: Mood normal.        Behavior: Behavior normal.     ED Results and Treatments Labs (all labs ordered are  listed, but only abnormal results are displayed) Labs Reviewed  BASIC METABOLIC PANEL - Abnormal; Notable for the following components:      Result Value   Sodium 133 (*)    Glucose, Bld 144 (*)    Calcium 8.3 (*)    All other components within normal limits  CBC - Abnormal; Notable for the following components:   WBC 3.9 (*)    Hemoglobin 12.9 (*)    All other components within normal limits  URINALYSIS, ROUTINE W REFLEX MICROSCOPIC  CBG MONITORING, ED  TROPONIN I (HIGH SENSITIVITY)                                                                                                                          Radiology DG Chest Port 1 View  Result Date: 01/19/2023 CLINICAL DATA:  Syncopal episode. EXAM: PORTABLE CHEST 1 VIEW COMPARISON:  None Available. FINDINGS: The cardiac size is normal. There is aortic atherosclerosis and tortuosity. The mediastinum is otherwise unremarkable. The lungs are mildly emphysematous but clear except for linear scarring or atelectasis in the left base. There is no substantial pleural effusion. Osteopenia. Chronic healed fracture deformity of the right clavicle. IMPRESSION: No evidence of acute chest disease. Emphysema. Aortic atherosclerosis. Electronically Signed   By: Almira Bar M.D.   On: 01/19/2023 21:19    Pertinent labs & imaging results that were available during my care of the patient were reviewed by me and considered in  my medical decision making (see MDM for details).  Medications Ordered in ED Medications - No data to display                                                                                                                                   Procedures Procedures  (including critical care time)  Medical Decision Making / ED Course   MDM:  77 year old male presenting to the emergency department syncope.  Patient is overall quite well-appearing, examination is nonfocal.  Given syncope without clear trigger, age, initial  hypotension, believe patient should be admitted for further evaluation such as echocardiogram and telemetry monitoring.  He has no history of cardiac problems but history is a little concerning for a cardiac problem.  Differential also includes vasovagal syncope or orthostatic syncope, however he had no clear trigger to precipitate these.  Considered seizure but no shaking activity and no clear postictal state.  He has no focal complaints to suggest other potential causes of syncope such as chest pain to suggest ACS or dissection, PE, dyspnea.  His troponin is negative.  Chest x-ray is unremarkable.  He will be admitted to the hospital by Dr. Mariea Clonts.      Additional history obtained: -Additional history obtained from ems -External records from outside source obtained and reviewed including: Chart review including previous notes, labs, imaging, consultation notes including prior ER visit for syncope last feb    Lab Tests: -I ordered, reviewed, and interpreted labs.   The pertinent results include:   Labs Reviewed  BASIC METABOLIC PANEL - Abnormal; Notable for the following components:      Result Value   Sodium 133 (*)    Glucose, Bld 144 (*)    Calcium 8.3 (*)    All other components within normal limits  CBC - Abnormal; Notable for the following components:   WBC 3.9 (*)    Hemoglobin 12.9 (*)    All other components within normal limits  URINALYSIS, ROUTINE W REFLEX MICROSCOPIC  CBG MONITORING, ED  TROPONIN I (HIGH SENSITIVITY)    Notable for mild anemia, mild hyponatremia   EKG   EKG Interpretation Date/Time:  Wednesday January 19 2023 20:49:17 EST Ventricular Rate:  79 PR Interval:  140 QRS Duration:  128 QT Interval:  387 QTC Calculation: 444 R Axis:   74  Text Interpretation: Sinus rhythm IVCD, consider atypical RBBB Confirmed by Alvino Blood ([id number]) on 01/19/2023 10:00:56 PM         Imaging Studies ordered: I ordered imaging studies including CXR On my  interpretation imaging demonstrates no acute process I independently visualized and interpreted imaging. I agree with the radiologist interpretation   Medicines ordered and prescription drug management: No orders of the defined types were placed in this encounter.   -I have reviewed the patients home medicines and have made adjustments as needed    Cardiac Monitoring: The patient was maintained on a cardiac  monitor.  I personally viewed and interpreted the cardiac monitored which showed an underlying rhythm of: NSR  Reevaluation: After the interventions noted above, I reevaluated the patient and found that their symptoms have resolved  Co morbidities that complicate the patient evaluation History reviewed. No pertinent past medical history.    Dispostion: Disposition decision including need for hospitalization was considered, and patient admitted to the hospital.    Final Clinical Impression(s) / ED Diagnoses Final diagnoses:  Syncope, unspecified syncope type     This chart was dictated using voice recognition software.  Despite best efforts to proofread,  errors can occur which can change the documentation meaning.    Lonell Grandchild, MD 01/19/23 2201

## 2023-01-19 NOTE — ED Notes (Signed)
ED TO INPATIENT HANDOFF REPORT  ED Nurse Name and Phone #: Jacques Earthly Name/Age/Gender Dennis Daniel 77 y.o. male Room/Bed: APA02/APA02  Code Status   Code Status: Not on file  Home/SNF/Other Home Patient oriented to: self, place, time, and situation Is this baseline? Yes   Triage Complete: Triage complete  Chief Complaint Syncope [R55]  Triage Note [facility] from home. Pt was eating dinner at a restaurant when he passed out. He has a history of it due to hypotension  60/40 108/60 18 l ac. ns given    Allergies No Known Allergies  Level of Care/Admitting Diagnosis ED Disposition     ED Disposition  Admit   Condition  --   Comment  Hospital Area: [facility] [[id number]]  Level of Care: Telemetry [5]  Covid Evaluation: Asymptomatic - no recent exposure (last 10 days) testing not required  Diagnosis: Syncope [206001]  Admitting Physician: Onnie Boer [[id number]]  Attending Physician: Onnie Boer [email or web address]          B Medical/Surgery History History reviewed. No pertinent past medical history. Past Surgical History:  Procedure Laterality Date   CATARACT EXTRACTION     left-[facility]   CATARACT EXTRACTION W/PHACO  02/25/2011   Procedure: CATARACT EXTRACTION PHACO AND INTRAOCULAR LENS PLACEMENT (IOC);  Surgeon: Gemma Payor;  Location: [facility];  Service: Ophthalmology;  Laterality: Right;  CDE: 10.71   TONSILLECTOMY     aschild     A IV Location/Drains/Wounds Patient Lines/Drains/Airways Status     Active Line/Drains/Airways     Name Placement date Placement time Site Days   Peripheral IV 18 G Left Antecubital --  --  Antecubital  --            Intake/Output Last 24 hours No intake or output data in the 24 hours ending 01/19/23 2201  Labs/Imaging Results for orders placed or performed during the hospital encounter of 01/19/23 (from the past 48 hour(s))  Basic metabolic panel     Status: Abnormal   Collection Time: 01/19/23  7:56  PM  Result Value Ref Range   Sodium 133 (L) 135 - 145 mmol/L   Potassium 3.7 3.5 - 5.1 mmol/L   Chloride 101 98 - 111 mmol/L   CO2 26 22 - 32 mmol/L   Glucose, Bld 144 (H) 70 - 99 mg/dL    Comment: Glucose reference range applies only to samples taken after fasting for at least 8 hours.   BUN 11 8 - 23 mg/dL   Creatinine, Ser 9.56 0.61 - 1.24 mg/dL   Calcium 8.3 (L) 8.9 - 10.3 mg/dL   GFR, Estimated >38 >75 mL/min    Comment: (NOTE) Calculated using the CKD-EPI Creatinine Equation (2021)    Anion gap 6 5 - 15    Comment: Performed at [facility], [address] [place], [place] [address]  CBC     Status: Abnormal   Collection Time: 01/19/23  7:56 PM  Result Value Ref Range   WBC 3.9 (L) 4.0 - 10.5 K/uL   RBC 4.28 4.22 - 5.81 MIL/uL   Hemoglobin 12.9 (L) 13.0 - 17.0 g/dL   HCT 95.1 88.4 - 16.6 %   MCV 91.4 80.0 - 100.0 fL   MCH 30.1 26.0 - 34.0 pg   MCHC 33.0 30.0 - 36.0 g/dL   RDW 06.3 01.6 - 01.0 %   Platelets 167 150 - 400 K/uL   nRBC 0.0 0.0 - 0.2 %    Comment: Performed at  [facility], [address] [place], [place] [address]  Troponin I (High Sensitivity)     Status: None   Collection Time: 01/19/23  7:56 PM  Result Value Ref Range   Troponin I (High Sensitivity) 4 <18 ng/L    Comment: (NOTE) Elevated high sensitivity troponin I (hsTnI) values and significant  changes across serial measurements may suggest ACS but many other  chronic and acute conditions are known to elevate hsTnI results.  Refer to the "Links" section for chest pain algorithms and additional  guidance. Performed at [facility], [address], [place], [place] [address]   CBG monitoring, ED     Status: None   Collection Time: 01/19/23  9:27 PM  Result Value Ref Range   Glucose-Capillary 87 70 - 99 mg/dL    Comment: Glucose reference range applies only to samples taken after fasting for at least 8 hours.   DG Chest Port 1 View  Result Date: 01/19/2023 CLINICAL DATA:  Syncopal episode. EXAM:  PORTABLE CHEST 1 VIEW COMPARISON:  None Available. FINDINGS: The cardiac size is normal. There is aortic atherosclerosis and tortuosity. The mediastinum is otherwise unremarkable. The lungs are mildly emphysematous but clear except for linear scarring or atelectasis in the left base. There is no substantial pleural effusion. Osteopenia. Chronic healed fracture deformity of the right clavicle. IMPRESSION: No evidence of acute chest disease. Emphysema. Aortic atherosclerosis. Electronically Signed   By: Almira Bar M.D.   On: 01/19/2023 21:19    Pending Labs Unresulted Labs (From admission, onward)     Start     Ordered   01/19/23 1937  Urinalysis, Routine w reflex microscopic -Urine, Clean Catch  Once,   URGENT       Question:  Specimen Source  Answer:  Urine, Clean Catch   01/19/23 1936            Vitals/Pain Today's Vitals   01/19/23 2050 01/19/23 2050 01/19/23 2100 01/19/23 2130  BP: (!) 131/55  118/69 106/65  Pulse: 78  69 62  Resp: 15  18 17   Temp:      SpO2: 100%  99% 99%  Weight:      Height:      PainSc:  0-No pain      Isolation Precautions No active isolations  Medications Medications - No data to display  Mobility walks     Focused Assessments    R Recommendations: See Admitting Provider Note  Report given to:   Additional Notes: A&O; Ambu; 18G RAC

## 2023-01-19 NOTE — ED Notes (Signed)
Attempted to call report to [id number]. Asked to call back in 

## 2023-01-19 NOTE — H&P (Signed)
History and Physical    Dennis Daniel JYN:[id number] DOB: 08/13/1945 DOA: 01/19/2023  PCP: Carylon Perches, MD   Patient coming from: Home  I have personally briefly reviewed patient's old medical records in [facility]  Chief Complaint: Syncopal episode  HPI: Dennis Daniel is a 77 y.o. male with medical history significant for hypothyroidism.  Patient presented to the ED reports of passing out. Patient was eating dinner at a restaurant with family when he passed out.  He did not hit his head.  Family helped him down to the floor.  He did not know he was about to pass out, he denies any prodrome mild symptoms.  Denies prior dizziness.  No chest pains no difficulty breathing.  Quit smoking cigarettes 16 years ago.  Does not drink alcohol.  Apart from Synthroid he is not on any other medications.  He lives alone, appetite is okay , there has been no recent change in his oral intake, no vomiting no diarrhea. EDP reports patient had passed out similarly in the past, but patient denies any prior syncopal episodes to me.  EMS reports blood pressure down to 60/40 on scene.   ED Course: Blood pressure 106-131 systolic.  Heart rate 60s to 84.  Troponin 4.  EKG shows sinus rhythm with no significant change from prior. Hospitalist to admit for syncopal episode.  Review of Systems: As per HPI all other systems reviewed and negative.  History reviewed. No pertinent past medical history.  Past Surgical History:  Procedure Laterality Date   CATARACT EXTRACTION     left-[facility]   CATARACT EXTRACTION W/PHACO  02/25/2011   Procedure: CATARACT EXTRACTION PHACO AND INTRAOCULAR LENS PLACEMENT (IOC);  Surgeon: Gemma Payor;  Location: [facility];  Service: Ophthalmology;  Laterality: Right;  CDE: 10.71   TONSILLECTOMY     aschild     reports that he quit smoking about 16 years ago. His smoking use included cigarettes. He started smoking about 56 years ago. He has a 80 pack-year smoking history. He does not  have any smokeless tobacco history on file. He reports that he does not drink alcohol and does not use drugs.  No Known Allergies  Family History  Problem Relation Age of Onset   Anesthesia problems Neg Hx    Hypotension Neg Hx    Malignant hyperthermia Neg Hx    Pseudochol deficiency Neg Hx     Prior to Admission medications   Medication Sig Start Date End Date Taking? Authorizing Provider  donepezil (ARICEPT) 5 MG tablet Take 5 mg by mouth every morning. 03/22/22  Yes [provider]  levothyroxine (SYNTHROID) 137 MCG tablet Take 137 mcg by mouth daily. 03/29/22  Yes [provider]    Physical Exam: Vitals:   01/19/23 1926 01/19/23 2050 01/19/23 2100 01/19/23 2130  BP:  (!) 131/55 118/69 106/65  Pulse:  78 69 62  Resp:  15 18 17   Temp:      SpO2:  100% 99% 99%  Weight: 68.5 kg     Height: 6' (1.829 m)       Constitutional: NAD, calm, comfortable Vitals:   01/19/23 1926 01/19/23 2050 01/19/23 2100 01/19/23 2130  BP:  (!) 131/55 118/69 106/65  Pulse:  78 69 62  Resp:  15 18 17   Temp:      SpO2:  100% 99% 99%  Weight: 68.5 kg     Height: 6' (1.829 m)      Eyes: PERRL, lids and conjunctivae normal  ENMT: Mucous membranes are mildly dry. Neck: normal, supple, no masses, no thyromegaly Respiratory: clear to auscultation bilaterally, no wheezing, no crackles. Normal respiratory effort. No accessory muscle use.  Cardiovascular: Regular rate and rhythm, no murmurs / rubs / gallops. No extremity edema.  Extremities warm.    Abdomen: no tenderness, no masses palpated. No hepatosplenomegaly. Bowel sounds positive.  Musculoskeletal: no clubbing / cyanosis. No joint deformity upper and lower extremities. Skin: no rashes, lesions, ulcers. No induration Neurologic: No facial asymmetry, moving extremities spontaneously.  Psychiatric: Normal judgment and insight. Alert and oriented x 3. Normal mood.   Labs on Admission: I have personally reviewed following labs and  imaging studies  CBC: Recent Labs  Lab 01/19/23 1956  WBC 3.9*  HGB 12.9*  HCT 39.1  MCV 91.4  PLT 167   Basic Metabolic Panel: Recent Labs  Lab 01/19/23 1956  NA 133*  K 3.7  CL 101  CO2 26  GLUCOSE 144*  BUN 11  CREATININE 0.96  CALCIUM 8.3*   CBG: Recent Labs  Lab 01/19/23 2127  GLUCAP 87   Radiological Exams on Admission: DG Chest Port 1 View  Result Date: 01/19/2023 CLINICAL DATA:  Syncopal episode. EXAM: PORTABLE CHEST 1 VIEW COMPARISON:  None Available. FINDINGS: The cardiac size is normal. There is aortic atherosclerosis and tortuosity. The mediastinum is otherwise unremarkable. The lungs are mildly emphysematous but clear except for linear scarring or atelectasis in the left base. There is no substantial pleural effusion. Osteopenia. Chronic healed fracture deformity of the right clavicle. IMPRESSION: No evidence of acute chest disease. Emphysema. Aortic atherosclerosis. Electronically Signed   By: Almira Bar M.D.   On: 01/19/2023 21:19    EKG: Independently reviewed.  Sinus rhythm, rate 79, QTc 444.  No significant change from prior.  Assessment/Plan Principal Problem:   Syncope Active Problems:   Hypothyroidism   Assessment and Plan: * Syncope Syncopal episode without prodrome.  EKG unchanged, troponin 4.  EMS reports blood pressure down to 60/40.  No suggestion of vasovagal etiology at this time, or explanation for hypotension.  No GI losses, poor oral intake.  Chest x-ray negative for acute abnormality. - N/s + 20 kcl 100cc/hr x 20hrs -Obtain echocardiogram -Trend troponin -Check magnesium, TSH -May need to consider outpatient cardiac monitoring  Hypothyroidism Resume Synthroid   DVT prophylaxis: Lovenox Code Status: FULL Family Communication: None at bedside Disposition Plan: ~ 1- 2 days Consults called: None.  Admission status:  Obs Tele     Author: Onnie Boer, MD 01/19/2023 10:32 PM  For on call review  [email or web address].

## 2023-01-19 NOTE — ED Notes (Signed)
Attempted to call report to [phone]

## 2023-01-20 ENCOUNTER — Observation Stay (HOSPITAL_COMMUNITY): Payer: Medicare PPO

## 2023-01-20 DIAGNOSIS — R55 Syncope and collapse: Secondary | ICD-10-CM

## 2023-01-20 DIAGNOSIS — E871 Hypo-osmolality and hyponatremia: Secondary | ICD-10-CM

## 2023-01-20 DIAGNOSIS — E860 Dehydration: Secondary | ICD-10-CM | POA: Diagnosis not present

## 2023-01-20 LAB — BASIC METABOLIC PANEL
Anion gap: 8 (ref 5–15)
BUN: 12 mg/dL (ref 8–23)
CO2: 25 mmol/L (ref 22–32)
Calcium: 8.4 mg/dL — ABNORMAL LOW (ref 8.9–10.3)
Chloride: 108 mmol/L (ref 98–111)
Creatinine, Ser: 0.93 mg/dL (ref 0.61–1.24)
GFR, Estimated: >60 mL/min (ref >60)
Glucose, Bld: 143 mg/dL — ABNORMAL HIGH (ref 70–99)
Potassium: 3.9 mmol/L (ref 3.5–5.1)
Sodium: 141 mmol/L (ref 135–145)

## 2023-01-20 LAB — CBC
HCT: 37.9 % — ABNORMAL LOW (ref 39.0–52.0)
Hemoglobin: 12.7 g/dL — ABNORMAL LOW (ref 13.0–17.0)
MCH: 30.7 pg (ref 26.0–34.0)
MCHC: 33.5 g/dL (ref 30.0–36.0)
MCV: 91.5 fL (ref 80.0–100.0)
Platelets: 169 10*3/uL (ref 150–400)
RBC: 4.14 MIL/uL — ABNORMAL LOW (ref 4.22–5.81)
RDW: 13.6 % (ref 11.5–15.5)
WBC: 5.6 10*3/uL (ref 4.0–10.5)
nRBC: 0 % (ref 0.0–0.2)

## 2023-01-20 LAB — ECHOCARDIOGRAM COMPLETE
Area-P 1/2: 4.24 cm2
Height: 72 in
S' Lateral: 3.3 cm
Weight: 2312 [oz_av]

## 2023-01-20 LAB — CK: Total CK: 225 U/L (ref 49–397)

## 2023-01-20 LAB — VITAMIN B12: Vitamin B-12: 143 pg/mL — ABNORMAL LOW (ref 180–914)

## 2023-01-20 LAB — FOLATE: Folate: 5.8 ng/mL — ABNORMAL LOW (ref >5.9)

## 2023-01-20 LAB — MAGNESIUM: Magnesium: 2.1 mg/dL (ref 1.7–2.4)

## 2023-01-20 MED ORDER — FOLIC ACID 1 MG PO TABS: 1.0000 mg | ORAL_TABLET | Freq: Every day | ORAL | Status: DC

## 2023-01-20 MED ORDER — FOLIC ACID 1 MG PO TABS: 1.0000 mg | ORAL_TABLET | Freq: Every day | ORAL | Status: AC

## 2023-01-20 NOTE — Hospital Course (Addendum)
77 y/o male with history of cognitive impairment and hypothyroidism presents with syncope.  Pt is a poor historian due to cognitive impairment.  History is supplement by son who witnessed the event.  The patient was sitting elevated finishing up dinner at a local establishment when he laid his head into his arms and stated "I don't feel right".  A few seconds later, the patient slumped over to the side and began sliding down onto the floor from the booth.  His son went over to ease the patient onto the floor.  Son stated that the patient had his eyes open by the time and was awake, but did not answer any questions or respond to calling his name.  Apparently, the patient still had a piece of food in his mouth, but there was no regurgitation or choking.  About a minute or 2 after lying on the floor, the patient became more alert and was able to answer some basic questions although with slow responses.  About 5 to 10 minutes later, EMS arrived.  By that time, the patient was awake and oriented and answering questions appropriately. There was no witnessed shaking or tonic-clonic activity.  There is no bowel or bladder incontinence.  The patient did not bite his tongue or lips.  There were no prodromal symptoms. Notably, the patient had another syncopal episode in February 2024.  He was felt that episode may have been vagally mediated as the patient was using the bathroom.  Also, the patient was not taking his thyroid medicine at that time which may have contributed to his presentation.  Patient's son states that the patient's oral intake is poor overall.  There is been no recent fevers, chills, chest pain, short of breath, nausea, vomiting, diarrhea,, dysuria, hematuria.  There have been no changes in his medications.  In the ED, the patient was afebrile hemodynamically stable with oxygen saturation 99% room air.  WBC 3.9, hemoglobin 12.9, platelets 167.  Sodium 133, potassium 3.7, bicarb 26, serum creatinine  0.96.  Troponin 4>>4.  EKG showed sinus rhythm nonspecific T wave changes.  UA was negative for pyuria.  TSH 1.432.  Chest x-ray was negative for any infiltrates or edema.  Pt was started on IVF. The patient was monitored on telemetry.  There were no concerning dysrhythmias.  He remained in sinus bradycardia throughout the hospitalization with heart rate in the low 50s.  Echocardiogram was performed which was normal.

## 2023-01-20 NOTE — Care Management Obs Status (Signed)
MEDICARE OBSERVATION STATUS NOTIFICATION   Patient Details  Name: Dennis Daniel MRN: [id number] Date of Birth: September 22, 1945   Medicare Observation Status Notification Given:  Yes    Leitha Bleak, RN 01/20/2023, 12:58 PM

## 2023-01-20 NOTE — Progress Notes (Signed)
  Echocardiogram 2D Echocardiogram has been performed.  Dennis Daniel 01/20/2023, 10:45 AM

## 2023-01-20 NOTE — Progress Notes (Signed)
   01/20/23 1053  TOC Brief Assessment  Insurance and Status Reviewed  Patient has primary care physician Yes  Home environment has been reviewed Home with spouse  Prior level of function: Independent  Prior/Current Home Services No current home services  Social Determinants of Health Reivew SDOH reviewed no interventions necessary  Readmission risk has been reviewed Yes  Transition of care needs no transition of care needs at this time   Transition of Care Department [facility]) has reviewed patient and no TOC needs have been identified at this time. We will continue to monitor patient advancement through interdisciplinary progression rounds. If new patient transition needs arise, please place a TOC consult.

## 2023-01-20 NOTE — Discharge Summary (Signed)
Physician Discharge Summary   Patient: Dennis Daniel MRN: [id number] DOB: 02-04-1946  Admit date:     01/19/2023  Discharge date: 01/20/23  Discharge Physician: Onalee Hua Macio Kissoon   PCP: Carylon Perches, MD   Recommendations at discharge:   Please follow up with primary care provider within 1-2 weeks  Please repeat BMP and CBC in one week      Hospital Course: 77 y/o male with history of cognitive impairment and hypothyroidism presents with syncope.  Pt is a poor historian due to cognitive impairment.  History is supplement by son who witnessed the event.  The patient was sitting elevated finishing up dinner at a local establishment when he laid his head into his arms and stated "I don't feel right".  A few seconds later, the patient slumped over to the side and began sliding down onto the floor from the booth.  His son went over to ease the patient onto the floor.  Son stated that the patient had his eyes open by the time and was awake, but did not answer any questions or respond to calling his name.  Apparently, the patient still had a piece of food in his mouth, but there was no regurgitation or choking.  About a minute or 2 after lying on the floor, the patient became more alert and was able to answer some basic questions although with slow responses.  About 5 to 10 minutes later, EMS arrived.  By that time, the patient was awake and oriented and answering questions appropriately. There was no witnessed shaking or tonic-clonic activity.  There is no bowel or bladder incontinence.  The patient did not bite his tongue or lips.  There were no prodromal symptoms. Notably, the patient had another syncopal episode in February 2024.  He was felt that episode may have been vagally mediated as the patient was using the bathroom.  Also, the patient was not taking his thyroid medicine at that time which may have contributed to his presentation.  Patient's son states that the patient's oral intake is poor  overall.  There is been no recent fevers, chills, chest pain, short of breath, nausea, vomiting, diarrhea,, dysuria, hematuria.  There have been no changes in his medications.  In the ED, the patient was afebrile hemodynamically stable with oxygen saturation 99% room air.  WBC 3.9, hemoglobin 12.9, platelets 167.  Sodium 133, potassium 3.7, bicarb 26, serum creatinine 0.96.  Troponin 4>>4.  EKG showed sinus rhythm nonspecific T wave changes.  UA was negative for pyuria.  TSH 1.432.  Chest x-ray was negative for any infiltrates or edema.  Pt was started on IVF. The patient was monitored on telemetry.  There were no concerning dysrhythmias.  He remained in sinus bradycardia throughout the hospitalization with heart rate in the low 50s.  Echocardiogram was performed which was normal.  Assessment and Plan: Episode of altered level consciousness -Per the son's history, does not appear the patient had true syncope -telemetry = sinus bradycardia in low 50s, no concerning dysrhythmia -01/20/23 Echo EF 55-60%, no WMA, normal RVF, no AS -UA neg for pyuria -I personally performed orthostatics which were negative. -very low clinical suspicion for seizure -likely multifactorial including dehydration vs dysrhythmia -Cardiology has been contacted to send patient home heart monitor/Zio patch -also requested cardiology referral  Hyponatremia -due to volume depletion and poor solute intake -pt fluid resuscitated>>improved  Sinus bradycardia -aricept may be contributing -hemodynamically stable  Blepharitis -left eye -no loss of visual acuity -educated patient on  good hygiene -try warm compresses -if no improvement in a month, follow up with ophthalmology  Hypothyroidism -continue synthroid -TSH 1.432  Cognitive impairment -continue Aricept  Low folate -folic acid 5.8 -start folic acid 1 mg po daily       Consultants: none Procedures performed: none  Disposition: Home Diet  recommendation:  Regular diet DISCHARGE MEDICATION: Allergies as of 01/20/2023   No Known Allergies      Medication List     TAKE these medications    donepezil 5 MG tablet Commonly known as: ARICEPT Take 5 mg by mouth every morning.   folic acid 1 MG tablet Commonly known as: FOLVITE Take 1 tablet (1 mg total) by mouth daily.   levothyroxine 137 MCG tablet Commonly known as: SYNTHROID Take 137 mcg by mouth daily.        Discharge Exam: Filed Weights   01/19/23 1926 01/19/23 2315  Weight: 68.5 kg 65.5 kg   HEENT:  [place]/AT, No thrush, no icterus CV:  RRR, no rub, no S3, no S4 Lung:  diminished BS.  Bibasilar rales.  No wheeze Abd:  soft/+BS, NT Ext:  No edema, no lymphangitis, no synovitis, no rash Neuro:  CN II-XII intact, strength 4/5 in RUE, RLE, strength 4/5 LUE, LLE; sensation intact bilateral; no dysmetria; babinski equivocal    Condition at discharge: stable  The results of significant diagnostics from this hospitalization (including imaging, microbiology, ancillary and laboratory) are listed below for reference.   Imaging Studies: ECHOCARDIOGRAM COMPLETE  Result Date: 01/20/2023    ECHOCARDIOGRAM REPORT   Patient Name:   Dennis Daniel Date of Exam: 01/20/2023 Medical Rec #:  [id number]        Height:       72.0 in Accession #:    [id number]       Weight:       144.5 lb Date of Birth:  04-20-1945        BSA:          1.856 m Patient Age:    88 years         BP:           128/96 mmHg Patient Gender: M                HR:           54 bpm. [facility]am [facility] Procedure: 2D Echo, Color Doppler and Cardiac Doppler Indications:    syncope  History:        Patient has no prior history of Echocardiogram examinations.                 Signs/Symptoms:Syncope.  Sonographer:    Delcie Roch RDCS Referring Phys: [phone] Duwayne Matters IMPRESSIONS  1. Left ventricular ejection fraction, by estimation, is 55 to 60%. The left ventricle has normal function. The left  ventricle has no regional wall motion abnormalities. Left ventricular diastolic parameters are consistent with Grade I diastolic dysfunction (impaired relaxation).  2. Right ventricular systolic function is normal. The right ventricular size is normal.  3. The mitral valve is normal in structure. No evidence of mitral valve regurgitation. No evidence of mitral stenosis.  4. The aortic valve is tricuspid. Aortic valve regurgitation is not visualized. No aortic stenosis is present.  5. The inferior vena cava is normal in size with greater than 50% respiratory variability, suggesting right atrial pressure of 3 mmHg. Comparison(s): No prior Echocardiogram. FINDINGS  Left Ventricle: Left ventricular ejection fraction, by estimation,  is 55 to 60%. The left ventricle has normal function. The left ventricle has no regional wall motion abnormalities. The left ventricular internal cavity size was normal in size. There is  no left ventricular hypertrophy. Left ventricular diastolic parameters are consistent with Grade I diastolic dysfunction (impaired relaxation). Normal left ventricular filling pressure. Right Ventricle: The right ventricular size is normal. No increase in right ventricular wall thickness. Right ventricular systolic function is normal. Left Atrium: Left atrial size was normal in size. Right Atrium: Right atrial size was normal in size. Pericardium: There is no evidence of pericardial effusion. Mitral Valve: The mitral valve is normal in structure. No evidence of mitral valve regurgitation. No evidence of mitral valve stenosis. Tricuspid Valve: The tricuspid valve is normal in structure. Tricuspid valve regurgitation is not demonstrated. No evidence of tricuspid stenosis. Aortic Valve: The aortic valve is tricuspid. Aortic valve regurgitation is not visualized. No aortic stenosis is present. Pulmonic Valve: The pulmonic valve was not well visualized. Pulmonic valve regurgitation is not visualized. No evidence  of pulmonic stenosis. Aorta: The aortic root and ascending aorta are structurally normal, with no evidence of dilitation. Venous: The inferior vena cava is normal in size with greater than 50% respiratory variability, suggesting right atrial pressure of 3 mmHg. IAS/Shunts: No atrial level shunt detected by color flow Doppler.  LEFT VENTRICLE PLAX 2D LVIDd:         4.60 cm   Diastology LVIDs:         3.30 cm   LV e' medial:    8.27 cm/s LV PW:         0.90 cm   LV E/e' medial:  11.6 LV IVS:        1.00 cm   LV e' lateral:   9.79 cm/s LVOT diam:     2.10 cm   LV E/e' lateral: 9.8 LV SV:         81 LV SV Index:   43 LVOT Area:     3.46 cm  RIGHT VENTRICLE             IVC RV Basal diam:  2.20 cm     IVC diam: 1.90 cm RV S prime:     11.10 cm/s TAPSE (M-mode): 2.4 cm LEFT ATRIUM             Index        RIGHT ATRIUM           Index LA diam:        3.60 cm 1.94 cm/m   RA Area:     11.90 cm LA Vol (A2C):   51.8 ml 27.91 ml/m  RA Volume:   27.30 ml  14.71 ml/m LA Vol (A4C):   44.2 ml 23.82 ml/m LA Biplane Vol: 48.0 ml 25.87 ml/m  AORTIC VALVE LVOT Vmax:   109.00 cm/s LVOT Vmean:  65.600 cm/s LVOT VTI:    0.233 m  AORTA Ao Root diam: 3.00 cm Ao Asc diam:  2.90 cm MITRAL VALVE MV Area (PHT): 4.24 cm    SHUNTS MV Decel Time: 179 msec    Systemic VTI:  0.23 m MV E velocity: 96.00 cm/s  Systemic Diam: 2.10 cm MV A velocity: 78.00 cm/s MV E/A ratio:  1.23 Vishnu Priya Mallipeddi Electronically signed by Winfield Rast Mallipeddi Signature Date/Time: 01/20/2023/3:12:42 PM    Final    DG Chest Port 1 View  Result Date: 01/19/2023 CLINICAL DATA:  Syncopal episode. EXAM: PORTABLE CHEST 1 VIEW COMPARISON:  None Available. FINDINGS: The cardiac size is normal. There is aortic atherosclerosis and tortuosity. The mediastinum is otherwise unremarkable. The lungs are mildly emphysematous but clear except for linear scarring or atelectasis in the left base. There is no substantial pleural effusion. Osteopenia. Chronic healed  fracture deformity of the right clavicle. IMPRESSION: No evidence of acute chest disease. Emphysema. Aortic atherosclerosis. Electronically Signed   By: Almira Bar M.D.   On: 01/19/2023 21:19    Microbiology: Results for orders placed or performed during the hospital encounter of 03/31/22  Resp panel by RT-PCR (RSV, Flu A&B, Covid) Anterior Nasal Swab     Status: None   Collection Time: 03/31/22  8:33 PM   Specimen: Anterior Nasal Swab  Result Value Ref Range Status   SARS Coronavirus 2 by RT PCR NEGATIVE NEGATIVE Final    Comment: (NOTE) SARS-CoV-2 target nucleic acids are NOT DETECTED.  The SARS-CoV-2 RNA is generally detectable in upper respiratory specimens during the acute phase of infection. The lowest concentration of SARS-CoV-2 viral copies this assay can detect is 138 copies/mL. A negative result does not preclude SARS-Cov-2 infection and should not be used as the sole basis for treatment or other patient management decisions. A negative result may occur with  improper specimen collection/handling, submission of specimen other than nasopharyngeal swab, presence of viral mutation(s) within the areas targeted by this assay, and inadequate number of viral copies(<138 copies/mL). A negative result must be combined with clinical observations, patient history, and epidemiological information. The expected result is Negative.  Fact Sheet for Patients:  BloggerCourse.com  Fact Sheet for Healthcare Providers:  SeriousBroker.it  This test is no t yet approved or cleared by the [place] FDA and  has been authorized for detection and/or diagnosis of SARS-CoV-2 by FDA under an Emergency Use Authorization (EUA). This EUA will remain  in effect (meaning this test can be used) for the duration of the COVID-19 declaration under Section 564(b)(1) of the Act, 21 U.S.C.section 360bbb-3(b)(1), unless the authorization is terminated   or revoked sooner.       Influenza A by PCR NEGATIVE NEGATIVE Final   Influenza B by PCR NEGATIVE NEGATIVE Final    Comment: (NOTE) The Xpert Xpress SARS-CoV-2/FLU/RSV plus assay is intended as an aid in the diagnosis of influenza from Nasopharyngeal swab specimens and should not be used as a sole basis for treatment. Nasal washings and aspirates are unacceptable for Xpert Xpress SARS-CoV-2/FLU/RSV testing.  Fact Sheet for Patients: BloggerCourse.com  Fact Sheet for Healthcare Providers: SeriousBroker.it  This test is not yet approved or cleared by the [place] FDA and has been authorized for detection and/or diagnosis of SARS-CoV-2 by FDA under an Emergency Use Authorization (EUA). This EUA will remain in effect (meaning this test can be used) for the duration of the COVID-19 declaration under Section 564(b)(1) of the Act, 21 U.S.C. section 360bbb-3(b)(1), unless the authorization is terminated or revoked.     Resp Syncytial Virus by PCR NEGATIVE NEGATIVE Final    Comment: (NOTE) Fact Sheet for Patients: BloggerCourse.com  Fact Sheet for Healthcare Providers: SeriousBroker.it  This test is not yet approved or cleared by the [place] FDA and has been authorized for detection and/or diagnosis of SARS-CoV-2 by FDA under an Emergency Use Authorization (EUA). This EUA will remain in effect (meaning this test can be used) for the duration of the COVID-19 declaration under Section 564(b)(1) of the Act, 21 U.S.C. section 360bbb-3(b)(1), unless the authorization is terminated or revoked.  Performed at  [facility], [address], [place], [place] [address]     Labs: CBC: Recent Labs  Lab 01/19/23 1956 01/20/23 0413  WBC 3.9* 5.6  HGB 12.9* 12.7*  HCT 39.1 37.9*  MCV 91.4 91.5  PLT 167 169   Basic Metabolic Panel: Recent Labs  Lab 01/19/23 1956  01/20/23 1457  NA 133* 141  K 3.7 3.9  CL 101 108  CO2 26 25  GLUCOSE 144* 143*  BUN 11 12  CREATININE 0.96 0.93  CALCIUM 8.3* 8.4*  MG 2.2 2.1   Liver Function Tests: No results for input(s): "AST", "ALT", "ALKPHOS", "BILITOT", "PROT", "ALBUMIN" in the last 168 hours. CBG: Recent Labs  Lab 01/19/23 2127  GLUCAP 87    Discharge time spent: greater than 30 minutes.  Signed: Catarina Hartshorn, MD [facility] 01/20/2023

## 2023-01-24 ENCOUNTER — Telehealth: Payer: Self-pay | Admitting: *Deleted

## 2023-01-24 ENCOUNTER — Other Ambulatory Visit: Payer: Medicare PPO

## 2023-01-24 DIAGNOSIS — R55 Syncope and collapse: Secondary | ICD-10-CM

## 2023-01-24 NOTE — Telephone Encounter (Signed)
Order placed and pt enrolled in [facility].

## 2023-01-24 NOTE — Telephone Encounter (Signed)
-----   Message from Dennis Daniel Tat sent at 01/20/2023  3:53 PM EST ----- Can you please send 2 week heart monitor to this patient for syncope?  Can you please also set up new patient appointment for this patient for cardiology here in [place]? Reason is syncope.  His son, Riley Lam is the point of contact.  Thank you,  Dennis Daniel

## 2023-01-25 DIAGNOSIS — E039 Hypothyroidism, unspecified: Secondary | ICD-10-CM | POA: Diagnosis not present

## 2023-01-25 DIAGNOSIS — R55 Syncope and collapse: Secondary | ICD-10-CM | POA: Diagnosis not present

## 2023-02-07 DIAGNOSIS — R55 Syncope and collapse: Secondary | ICD-10-CM

## 2023-02-08 DIAGNOSIS — R55 Syncope and collapse: Secondary | ICD-10-CM | POA: Diagnosis not present

## 2023-09-07 DIAGNOSIS — Z20828 Contact with and (suspected) exposure to other viral communicable diseases: Secondary | ICD-10-CM | POA: Diagnosis not present
